# Patient Record
Sex: Male | Born: 1985 | Race: White | Hispanic: No | Marital: Married | Smoking: Never smoker
Health system: Southern US, Community
[De-identification: ages and names within clinical notes are randomized; demographics above are authoritative.]

## PROBLEM LIST (undated history)

## (undated) DIAGNOSIS — E039 Hypothyroidism, unspecified: Secondary | ICD-10-CM

## (undated) DIAGNOSIS — B1920 Unspecified viral hepatitis C without hepatic coma: Secondary | ICD-10-CM

## (undated) DIAGNOSIS — F1910 Other psychoactive substance abuse, uncomplicated: Secondary | ICD-10-CM

## (undated) DIAGNOSIS — I10 Essential (primary) hypertension: Secondary | ICD-10-CM

## (undated) DIAGNOSIS — F419 Anxiety disorder, unspecified: Secondary | ICD-10-CM

## (undated) HISTORY — DX: Essential (primary) hypertension: I10

## (undated) HISTORY — PX: OTHER SURGICAL HISTORY: SHX169

## (undated) HISTORY — DX: Hypothyroidism, unspecified: E03.9

## (undated) HISTORY — PX: BREAST SURGERY: SHX581

## (undated) HISTORY — DX: Unspecified viral hepatitis C without hepatic coma: B19.20

---

## 2009-06-17 ENCOUNTER — Ambulatory Visit (HOSPITAL_COMMUNITY): Admission: RE | Admit: 2009-06-17 | Discharge: 2009-06-17 | Payer: Self-pay | Admitting: Psychiatry

## 2010-04-18 ENCOUNTER — Emergency Department (HOSPITAL_COMMUNITY)
Admission: EM | Admit: 2010-04-18 | Discharge: 2010-04-19 | Disposition: A | Discharge status: Home / Self Care | Payer: BLUE CROSS/BLUE SHIELD | Attending: Emergency Medicine | Admitting: Emergency Medicine

## 2010-04-18 DIAGNOSIS — F1110 Opioid abuse, uncomplicated: Secondary | ICD-10-CM | POA: Insufficient documentation

## 2010-04-18 DIAGNOSIS — M549 Dorsalgia, unspecified: Secondary | ICD-10-CM | POA: Insufficient documentation

## 2010-04-18 DIAGNOSIS — G8929 Other chronic pain: Secondary | ICD-10-CM | POA: Insufficient documentation

## 2010-04-19 LAB — CBC
Hemoglobin: 13.1 g/dL (ref 13.0–17.0)
MCH: 26.8 pg (ref 26.0–34.0)
Platelets: 178 10*3/uL (ref 150–400)
RBC: 4.89 MIL/uL (ref 4.22–5.81)
WBC: 9.8 10*3/uL (ref 4.0–10.5)

## 2010-04-19 LAB — RAPID URINE DRUG SCREEN, HOSP PERFORMED
Amphetamines: NOT DETECTED
Barbiturates: NOT DETECTED
Benzodiazepines: POSITIVE — AB
Cocaine: NOT DETECTED
Opiates: POSITIVE — AB

## 2010-04-19 LAB — BASIC METABOLIC PANEL
BUN: 8 mg/dL (ref 6–23)
Chloride: 94 mEq/L — ABNORMAL LOW (ref 96–112)
GFR calc Af Amer: >60 mL/min (ref >60)
GFR calc non Af Amer: >60 mL/min (ref >60)
Potassium: 3.8 mEq/L (ref 3.5–5.1)
Sodium: 138 mEq/L (ref 135–145)

## 2010-04-19 LAB — ETHANOL: Alcohol, Ethyl (B): <5 mg/dL (ref 0–10)

## 2010-04-19 LAB — DIFFERENTIAL
Basophils Relative: 0 % (ref 0–1)
Monocytes Relative: 9 % (ref 3–12)
Neutro Abs: 6.2 10*3/uL (ref 1.7–7.7)
Neutrophils Relative %: 63 % (ref 43–77)

## 2010-04-19 LAB — URINALYSIS, ROUTINE W REFLEX MICROSCOPIC
Bilirubin Urine: NEGATIVE
Glucose, UA: NEGATIVE mg/dL
Hgb urine dipstick: NEGATIVE
Ketones, ur: NEGATIVE mg/dL
Protein, ur: NEGATIVE mg/dL
Urobilinogen, UA: 0.2 mg/dL (ref 0.0–1.0)

## 2010-04-21 ENCOUNTER — Emergency Department (HOSPITAL_COMMUNITY)
Admission: EM | Admit: 2010-04-21 | Discharge: 2010-04-21 | Disposition: A | Discharge status: Home / Self Care | Payer: BLUE CROSS/BLUE SHIELD | Attending: Emergency Medicine | Admitting: Emergency Medicine

## 2010-04-21 DIAGNOSIS — F1110 Opioid abuse, uncomplicated: Secondary | ICD-10-CM | POA: Insufficient documentation

## 2010-04-21 LAB — DIFFERENTIAL
Basophils Relative: 1 % (ref 0–1)
Eosinophils Absolute: 0.4 10*3/uL (ref 0.0–0.7)
Lymphs Abs: 2.2 10*3/uL (ref 0.7–4.0)
Monocytes Absolute: 0.6 10*3/uL (ref 0.1–1.0)
Monocytes Relative: 7 % (ref 3–12)
Neutrophils Relative %: 59 % (ref 43–77)

## 2010-04-21 LAB — CBC
MCH: 26.5 pg (ref 26.0–34.0)
MCHC: 32.4 g/dL (ref 30.0–36.0)
MCV: 81.7 fL (ref 78.0–100.0)
Platelets: 199 10*3/uL (ref 150–400)
RBC: 4.91 MIL/uL (ref 4.22–5.81)

## 2010-04-21 LAB — BASIC METABOLIC PANEL
BUN: 12 mg/dL (ref 6–23)
Chloride: 104 mEq/L (ref 96–112)
Creatinine, Ser: 1.05 mg/dL (ref 0.4–1.5)
Glucose, Bld: 82 mg/dL (ref 70–99)
Potassium: 4.5 mEq/L (ref 3.5–5.1)

## 2010-04-21 LAB — ETHANOL: Alcohol, Ethyl (B): <5 mg/dL (ref 0–10)

## 2010-04-21 LAB — RAPID URINE DRUG SCREEN, HOSP PERFORMED
Amphetamines: NOT DETECTED
Benzodiazepines: POSITIVE — AB

## 2010-06-11 DIAGNOSIS — R0602 Shortness of breath: Secondary | ICD-10-CM

## 2012-04-06 ENCOUNTER — Encounter (INDEPENDENT_AMBULATORY_CARE_PROVIDER_SITE_OTHER): Payer: Self-pay | Admitting: *Deleted

## 2012-04-11 ENCOUNTER — Ambulatory Visit (INDEPENDENT_AMBULATORY_CARE_PROVIDER_SITE_OTHER): Payer: BC Managed Care – PPO | Admitting: Internal Medicine

## 2012-06-25 ENCOUNTER — Emergency Department (HOSPITAL_COMMUNITY)
Admission: EM | Admit: 2012-06-25 | Discharge: 2012-06-25 | Disposition: A | Discharge status: Home / Self Care | Payer: BC Managed Care – PPO | Attending: Emergency Medicine | Admitting: Emergency Medicine

## 2012-06-25 ENCOUNTER — Encounter (HOSPITAL_COMMUNITY): Payer: Self-pay | Admitting: *Deleted

## 2012-06-25 DIAGNOSIS — Z79899 Other long term (current) drug therapy: Secondary | ICD-10-CM | POA: Insufficient documentation

## 2012-06-25 DIAGNOSIS — F411 Generalized anxiety disorder: Secondary | ICD-10-CM | POA: Insufficient documentation

## 2012-06-25 DIAGNOSIS — L0231 Cutaneous abscess of buttock: Secondary | ICD-10-CM | POA: Insufficient documentation

## 2012-06-25 HISTORY — DX: Anxiety disorder, unspecified: F41.9

## 2012-06-25 MED ORDER — OXYCODONE-ACETAMINOPHEN 5-325 MG PO TABS
ORAL_TABLET | ORAL | Status: AC
  Filled 2012-06-25: qty 1

## 2012-06-25 MED ORDER — DOXYCYCLINE HYCLATE 100 MG PO TABS
100.0000 mg | ORAL_TABLET | Freq: Once | ORAL | Status: AC
  Administered 2012-06-25: 100 mg via ORAL
  Filled 2012-06-25: qty 1

## 2012-06-25 MED ORDER — OXYCODONE HCL 5 MG PO TABS
5.0000 mg | ORAL_TABLET | Freq: Once | ORAL | Status: AC
  Administered 2012-06-25: 5 mg via ORAL
  Filled 2012-06-25: qty 1

## 2012-06-25 MED ORDER — OXYCODONE HCL 5 MG PO TABS: 5.0000 mg | ORAL_TABLET | Freq: Four times a day (QID) | ORAL | Status: DC | PRN

## 2012-06-25 MED ORDER — DOXYCYCLINE HYCLATE 100 MG PO CAPS: 100.0000 mg | ORAL_CAPSULE | Freq: Two times a day (BID) | ORAL | Status: DC

## 2012-06-25 MED ORDER — LIDOCAINE HCL (PF) 1 % IJ SOLN
INTRAMUSCULAR | Status: AC
  Administered 2012-06-25: 22:00:00
  Filled 2012-06-25: qty 5

## 2012-06-25 MED ORDER — HYDROCODONE-ACETAMINOPHEN 5-325 MG PO TABS: ORAL_TABLET | ORAL | Status: DC

## 2012-06-25 MED ORDER — HYDROCODONE-ACETAMINOPHEN 5-325 MG PO TABS
1.0000 | ORAL_TABLET | Freq: Once | ORAL | Status: DC
  Filled 2012-06-25: qty 1

## 2012-06-25 NOTE — ED Notes (Signed)
Pt reporting abscess on buttocks that has been getting better, but when he stopped taking antibiotics it got worse.  Area red and swollen.

## 2012-06-25 NOTE — ED Provider Notes (Signed)
History     CSN: [id number]  Arrival date & time 06/25/12  2018   First MD Initiated Contact with Patient 06/25/12 2120      Chief Complaint  Patient presents with  . Abscess    (Consider location/radiation/quality/duration/timing/severity/associated sxs/prior treatment) HPI Comments: Patient with abscess to his left buttock states that a friend tried to drain the area with a needle and he was taking Keflex for several days then stopped and now comes to the ED c/o worsening pain and redness to the  Same area.  He denies fever, chills , red streaks, abd pain or vomiting.    Patient is a 27 y.o. male presenting with abscess. The history is provided by the patient.  Abscess Location:  Ano-genital Ano-genital abscess location:  L buttock Abscess quality: induration, painful and redness   Abscess quality: not draining and no fluctuance   Red streaking: no   Duration:  3 days Progression:  Worsening Pain details:    Quality:  Throbbing   Severity:  Moderate   Timing:  Constant   Progression:  Worsening Chronicity:  Recurrent Context: not diabetes and not injected drug use   Relieved by:  Nothing Exacerbated by: sitting. Ineffective treatments:  Oral antibiotics Associated symptoms: no fatigue, no fever, no nausea and no vomiting   Risk factors: no hx of MRSA     Past Medical History  Diagnosis Date  . Anxiety     History reviewed. No pertinent past surgical history.  History reviewed. No pertinent family history.  History  Substance Use Topics  . Smoking status: Not on file  . Smokeless tobacco: Not on file  . Alcohol Use: No      Review of Systems  Constitutional: Negative for fever, chills and fatigue.  Gastrointestinal: Negative for nausea and vomiting.  Musculoskeletal: Negative for joint swelling and arthralgias.  Skin: Positive for color change.       Abscess   Hematological: Negative for adenopathy.  All other systems reviewed and are  negative.    Allergies  Review of patient's allergies indicates no known allergies.  Home Medications   Current Outpatient Rx  Name  Route  Sig  Dispense  Refill  . ALPRAZolam (XANAX) 1 MG tablet   Oral   Take 1 mg by mouth 3 (three) times daily as needed for anxiety.           There were no vitals taken for this visit.  Physical Exam  Nursing note and vitals reviewed. Constitutional: He is oriented to person, place, and time. He appears well-developed and well-nourished. No distress.  HENT:  Head: Normocephalic and atraumatic.  Cardiovascular: Normal rate, regular rhythm and normal heart sounds.   Pulmonary/Chest: Effort normal and breath sounds normal. No respiratory distress.  Abdominal: Soft. He exhibits no distension. There is no tenderness. There is no rebound and no guarding.  Musculoskeletal: Normal range of motion.  Neurological: He is alert and oriented to person, place, and time. He exhibits normal muscle tone. Coordination normal.  Skin: Skin is warm. There is erythema.  Abscess to the mid left buttock near the gluteal fold.  No surrounding erythema or drainage.  Mild to moderate induration without fluctuance.    ED Course  Procedures (including critical care time)  Labs Reviewed - No data to display No results found.     MDM    INCISION AND DRAINAGE Performed by: Maxwell Caul. Consent: Verbal consent obtained. Risks and benefits: risks, benefits and alternatives were discussed Type:  abscess  Body area: left buttock Anesthesia: local infiltration  Incision was made with a #11 scalpel.  Local anesthetic: lidocaine 1 % w/o epinephrine  Anesthetic total: 4 ml  Complexity: complex Blunt dissection to break up loculations  Drainage: purulent  Drainage amount: moderate  Packing material: 1/4 in iodoform gauze  Patient tolerance: Patient tolerated the procedure well with no immediate complications.      patient agrees to warm water  soaks, packing removal in 2 days.  Return here if needed.  Pt agrees to doxy and d/c the keflex.  VSS.  Appears stable for d/c      Jakaylah Schlafer L. Trisha Mangle, PA-C 06/27/12 2059

## 2012-06-26 ENCOUNTER — Telehealth (HOSPITAL_COMMUNITY): Payer: Self-pay | Admitting: Emergency Medicine

## 2012-06-26 NOTE — ED Notes (Signed)
Pharmacy calling to verify just 1 Rx written.  Informed pharmacy 2 Rx given and pt must get abx filled in order to get pain med filled.

## 2012-06-29 NOTE — ED Provider Notes (Signed)
Medical screening examination/treatment/procedure(s) were performed by non-physician practitioner and as supervising physician I was immediately available for consultation/collaboration.  Donnetta Hutching, MD 06/29/12 0830

## 2012-07-10 ENCOUNTER — Emergency Department (HOSPITAL_COMMUNITY)
Admission: EM | Admit: 2012-07-10 | Discharge: 2012-07-11 | Disposition: A | Discharge status: Home / Self Care | Payer: BC Managed Care – PPO | Attending: Emergency Medicine | Admitting: Emergency Medicine

## 2012-07-10 ENCOUNTER — Encounter (HOSPITAL_COMMUNITY): Payer: Self-pay | Admitting: Emergency Medicine

## 2012-07-10 DIAGNOSIS — F1110 Opioid abuse, uncomplicated: Secondary | ICD-10-CM | POA: Insufficient documentation

## 2012-07-10 DIAGNOSIS — F411 Generalized anxiety disorder: Secondary | ICD-10-CM | POA: Insufficient documentation

## 2012-07-10 DIAGNOSIS — F1910 Other psychoactive substance abuse, uncomplicated: Secondary | ICD-10-CM

## 2012-07-10 DIAGNOSIS — Z79899 Other long term (current) drug therapy: Secondary | ICD-10-CM | POA: Insufficient documentation

## 2012-07-10 LAB — COMPREHENSIVE METABOLIC PANEL
Alkaline Phosphatase: 69 U/L (ref 39–117)
BUN: 14 mg/dL (ref 6–23)
Calcium: 9.1 mg/dL (ref 8.4–10.5)
Creatinine, Ser: 0.92 mg/dL (ref 0.50–1.35)
GFR calc Af Amer: >90 mL/min (ref >90)
Glucose, Bld: 88 mg/dL (ref 70–99)
Potassium: 3.7 mEq/L (ref 3.5–5.1)
Total Protein: 7.4 g/dL (ref 6.0–8.3)

## 2012-07-10 LAB — CBC WITH DIFFERENTIAL/PLATELET
Eosinophils Absolute: 0.2 10*3/uL (ref 0.0–0.7)
Eosinophils Relative: 2 % (ref 0–5)
HCT: 41.6 % (ref 39.0–52.0)
Hemoglobin: 13.8 g/dL (ref 13.0–17.0)
Lymphs Abs: 2 10*3/uL (ref 0.7–4.0)
MCH: 27.7 pg (ref 26.0–34.0)
MCHC: 33.2 g/dL (ref 30.0–36.0)
MCV: 83.5 fL (ref 78.0–100.0)
Monocytes Absolute: 0.5 10*3/uL (ref 0.1–1.0)
Monocytes Relative: 7 % (ref 3–12)
RBC: 4.98 MIL/uL (ref 4.22–5.81)

## 2012-07-10 LAB — RAPID URINE DRUG SCREEN, HOSP PERFORMED
Cocaine: NOT DETECTED
Opiates: NOT DETECTED

## 2012-07-10 LAB — ETHANOL: Alcohol, Ethyl (B): <11 mg/dL (ref 0–11)

## 2012-07-10 NOTE — ED Notes (Signed)
Blood draw attempted, no success, LAB called for 2nd attempt

## 2012-07-10 NOTE — ED Provider Notes (Signed)
History    CSN: [id number] Arrival date & time 07/10/12  2018  None    Chief Complaint  Patient presents with  . Drug Problem   (Consider location/radiation/quality/duration/timing/severity/associated sxs/prior Treatment) HPI Comments: 27 year old male who has a history of self-reported anxiety who has also had 2 significant overdoses of Opana in the last year. He presents today because of request for detox from his opiate abuse. He states that he started using these opiate medications 9 years ago, Vicodin was his initial drug, he has since graduated to stronger drugs and has been using them as IV routing. He denies fevers or chills numbness or vomiting nausea abdominal pain back pain chest pain coughing shortness of breath or diaphoresis. He states that he last used opiates this morning. He denies alcohol abuse, he uses excessive amounts of Xanax and has had 210 tablets of Xanax over last 10 days. He denies suicidal thoughts, states that he was recently engaged and wants to turn his life around.  The history is provided by the patient and a relative.   Past Medical History  Diagnosis Date  . Anxiety    History reviewed. No pertinent past surgical history. No family history on file. History  Substance Use Topics  . Smoking status: Not on file  . Smokeless tobacco: Not on file  . Alcohol Use: No    Review of Systems  All other systems reviewed and are negative.    Allergies  Review of patient's allergies indicates no known allergies.  Home Medications   Current Outpatient Rx  Name  Route  Sig  Dispense  Refill  . ALPRAZolam (XANAX) 1 MG tablet   Oral   Take 1 mg by mouth 3 (three) times daily as needed for anxiety.          BP 137/83  Pulse 68  Temp(Src) 98.1 F (36.7 C) (Oral)  Resp 20  SpO2 100% Physical Exam  Nursing note and vitals reviewed. Constitutional: He appears well-developed and well-nourished. No distress.  HENT:  Head: Normocephalic and  atraumatic.  Mouth/Throat: Oropharynx is clear and moist. No oropharyngeal exudate.  Eyes: Conjunctivae and EOM are normal. Pupils are equal, round, and reactive to light. Right eye exhibits no discharge. Left eye exhibits no discharge. No scleral icterus.  Neck: Normal range of motion. Neck supple. No JVD present. No thyromegaly present.  Cardiovascular: Normal rate, regular rhythm, normal heart sounds and intact distal pulses.  Exam reveals no gallop and no friction rub.   No murmur heard. Pulmonary/Chest: Effort normal and breath sounds normal. No respiratory distress. He has no wheezes. He has no rales.  Abdominal: Soft. Bowel sounds are normal. He exhibits no distension and no mass. There is no tenderness.  Musculoskeletal: Normal range of motion. He exhibits no edema and no tenderness.  Lymphadenopathy:    He has no cervical adenopathy.  Neurological: He is alert. Coordination normal.  Skin: Skin is warm and dry. No rash noted. No erythema.  Psychiatric: He has a normal mood and affect. His behavior is normal.  The patient has a normal affect, he is not hallucinating, nonresponding to internal stimuli, not agitated, not suicidal    ED Course  Procedures (including critical care time) Labs Reviewed  URINE RAPID DRUG SCREEN (HOSP PERFORMED) - Abnormal; Notable for the following:    Benzodiazepines POSITIVE (*)    All other components within normal limits  COMPREHENSIVE METABOLIC PANEL - Abnormal; Notable for the following:    AST 40 (*)  ALT 94 (*)    All other components within normal limits  ETHANOL  CBC WITH DIFFERENTIAL   No results found. 1. Substance abuse     MDM  REquesting help with opiate abuse -n o signs of acute withdrawal from either opiates or benzo's.  Will contact [facility] asap for placement.    The patient has court cases pending, he has not been accepted to the detox facility, he will pursue his options tomorrow morning with his lawyer, he appears medically  stable for discharge.  I have given him a copy of his lab work and it has been faxed to the detox facility at the patient's request and the detox facility's request.  Vida Roller, MD 07/11/12 [phone]

## 2012-07-10 NOTE — ED Notes (Signed)
PT. REQUESTING DETOX FOR OPIATE ( OPANA) ADDICTION , LAST OPIATE INTAKE THIS MORNING , DENIES SUICIDAL IDEATION . CALM AND COOPERATIVE.

## 2012-07-11 NOTE — ED Notes (Signed)
Pt denies any further questions or pain upon discharge. 

## 2012-07-11 NOTE — ED Notes (Signed)
Pt ask for a coke to drink and a warm blanket. Pt was given both.

## 2012-07-11 NOTE — ED Notes (Signed)
Plan of care is updated by MD with RN and family at bedside. Pt verbalizes understanding.

## 2012-08-31 ENCOUNTER — Ambulatory Visit (HOSPITAL_COMMUNITY): Payer: BC Managed Care – PPO | Attending: Internal Medicine

## 2013-07-05 ENCOUNTER — Encounter: Payer: Self-pay | Admitting: *Deleted

## 2013-07-07 ENCOUNTER — Emergency Department: Payer: Self-pay | Admitting: Emergency Medicine

## 2013-08-16 ENCOUNTER — Ambulatory Visit (INDEPENDENT_AMBULATORY_CARE_PROVIDER_SITE_OTHER): Payer: BC Managed Care – PPO | Admitting: Gastroenterology

## 2013-08-16 ENCOUNTER — Encounter: Payer: Self-pay | Admitting: Gastroenterology

## 2013-08-16 VITALS — BP 137/88 | HR 79 | Temp 98.1°F | Resp 20 | Ht 71.0 in | Wt 190.0 lb

## 2013-08-16 DIAGNOSIS — R894 Abnormal immunological findings in specimens from other organs, systems and tissues: Secondary | ICD-10-CM

## 2013-08-16 DIAGNOSIS — R7989 Other specified abnormal findings of blood chemistry: Secondary | ICD-10-CM

## 2013-08-16 DIAGNOSIS — R945 Abnormal results of liver function studies: Secondary | ICD-10-CM

## 2013-08-16 DIAGNOSIS — R768 Other specified abnormal immunological findings in serum: Secondary | ICD-10-CM

## 2013-08-16 NOTE — Assessment & Plan Note (Addendum)
28 y/o male with positive HCV Ab test at least one year ago. It is not clear if he has had RNA testing or genotyping. H/O prior prescription drug abuse as outlined above. Patient denies abuse in the past 12 months after rehabilitation. No h/o illicit drug use or etoh use. Discussed HCV and treatment options at length with patient today. First we need to confirm viremia and genotype. He will need to have liver imaging once that's complete. If he has chronic HCV, we will work on Therapist, occupationalinsurance approval. Modes of transmission discussed with patient. He needs to notify previous partners. [facility]. Further recommendations to follow.

## 2013-08-16 NOTE — Progress Notes (Addendum)
Primary Care Physician:  Catalina Pizza, MD  Primary Gastroenterologist:  Roetta Sessions, MD   Chief Complaint  Patient presents with  . Referral    HPI:  Daniel Burch is a 28 y.o. male here for management of Hepatitis C at the request of Dr. Catalina Pizza. States he was initially diagnosed around 1-2 years ago. Risk factors include multiple sexual partners, tatoos. Denies h/o illicit drugs but he has history of prescription drug abuse with Xanax, Opana. Overdoses of Opana/Xanax in 2013, hospitalized for 3 weeks at [facility] for kidney/liver failure. States he was hospitalized for syncope early 2014. Saw Dr. Andee Lineman. Ruled out for bacterial endocarditis, but had pulmonary HTN. I don't have the records but review by Dr. Margo Aye mentioned f/u ECHO with decrease in PA pressures and mildly elevated PA systolic pressures. Has not seen Dr. Andee Lineman in over one year.  Seen in ER 07/2012 requesting detox from Xanax. States he went to rehab and has done better over the past one year. Takes prescribed Xanax 1mg  BID for anxiety. Has knee issues, MRI pending, given oxycodone for pain, not taking regularly (given limited number for short term use only). Denies etoh use. Reports negative HIV testing while in rehab last year.   Feels okay. Feels sore in upper abdomen. Not necessarily related to meals. BMs fairly regular. No melena, brbpr, vomiting, heartburn.    Has a 57 month old son. No longer with mother of his child. Has girlfriend. None of his sexual partners aware of HCV ab + status. I don't have any records indicating HCV RNA or genotype.    Current Outpatient Prescriptions  Medication Sig Dispense Refill  . oxyCODONE-acetaminophen (PERCOCET/ROXICET) 5-325 MG per tablet Take by mouth every 4 (four) hours as needed for severe pain (as needed for knee pain, rarely takes).      . ALPRAZolam (XANAX) 1 MG tablet Take 1 mg by mouth 3 (three) times daily as needed for anxiety.       No current facility-administered medications  for this visit.    Allergies as of 08/16/2013  . (No Known Allergies)    Past Medical History  Diagnosis Date  . Anxiety   . Hepatitis C     Past Surgical History  Procedure Laterality Date  . None      Family History  Problem Relation Age of Onset  . Colon cancer Neg Hx   . Liver disease      History   Social History  . Marital Status: Single    Spouse Name: N/A    Number of Children: 1  . Years of Education: N/A   Occupational History  . 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 Smoking status: Never Smoker   . Smokeless tobacco: Not on file  . Alcohol Use: No  . Drug Use: Yes     Comment: history of opiate/benzos drug abuse in past  . Sexual Activity: Not on file     Comment:     Other Topics Concern  . Not on file   Social History Narrative  . No narrative on file      ROS:  General: Negative for anorexia, weight loss, fever, chills, fatigue, weakness. Eyes: Negative for vision changes.  ENT: Negative for hoarseness, difficulty swallowing , nasal congestion. CV: Negative for chest pain, angina, palpitations, dyspnea on exertion, peripheral edema.  Respiratory: Negative for dyspnea at rest, dyspnea on exertion, cough, sputum, wheezing.  GI: See history of present illness. GU:  Negative for dysuria, hematuria, urinary incontinence, urinary frequency, nocturnal urination.  MS: Negative for joint pain, low back pain.  Derm: Negative for rash or itching.  Neuro: Negative for weakness, abnormal sensation, seizure, frequent headaches, memory loss, confusion.  Psych: Negative for anxiety, depression, suicidal ideation, hallucinations.  Endo: Negative for unusual weight change.  Heme: Negative for bruising or bleeding. Allergy: Negative for rash or hives.    Physical Examination:  BP 137/88  Pulse 79  Temp(Src) 98.1 F (36.7 C) (Oral)  Resp 20  Ht 5\' 11"  (1.803 m)  Wt 190 lb (86.183 kg)  BMI 26.51 kg/m2    General: Well-nourished, well-developed in no acute distress.  Head: Normocephalic, atraumatic.   Eyes: Conjunctiva pink, no icterus. Mouth: Oropharyngeal mucosa moist and pink , no lesions erythema or exudate. Neck: Supple without thyromegaly, masses, or lymphadenopathy.  Lungs: Clear to auscultation bilaterally.  Heart: Regular rate and rhythm, no murmurs rubs or gallops.  Abdomen: Bowel sounds are normal, mild diffuse upper abdominal tenderness, nondistended, no hepatosplenomegaly or masses, no abdominal bruits or    hernia , no rebound or guarding.   Rectal: not performed Extremities: No lower extremity edema. No clubbing or deformities.  Neuro: Alert and oriented x 4 , grossly normal neurologically.  Skin: Warm and dry, no rash or jaundice.   Psych: Alert and cooperative, normal mood and affect.  Labs: Labs 06/14/13 WBC 7100, H/H 15.9/45.4, Plate 161,096264,000, BUN 15, Cre 0.93, Tbili 1, AP 50, AST 30, ALT 79H, alb 4.4, ca 9 Hep B surface Ag: negative (08/2012) Hep B Core Ab, IgM: negative (08/2012) Hep A Ab, IgM: negative (08/2012) Hep C Ab: reactive (08/2012)      Imaging Studies: No results found.

## 2013-08-16 NOTE — Patient Instructions (Signed)
1. Please have your blood work done ASAP. It will take some of it 7-10 days to return. Once we confirm if you have active Hep C, we will start process for treatment approval with your insurance company.  2. Please don't share nail clippers, razors, toothbrushes with anyone. You should notify your past and current sexual partners of Hep C status so they can seek testing/treatment as needed.  Hepatitis C Hepatitis C is a viral infection of the liver. Infection may go undetected for months or years because symptoms may be absent or very mild. Chronic liver disease is the main danger of hepatitis C. This may lead to scarring of the liver (cirrhosis), liver failure, and liver cancer. CAUSES  Hepatitis C is caused by the hepatitis C virus (HCV). Formerly, hepatitis C infections were most commonly transmitted through blood transfusions. In the early 1990s, routine testing of donated blood for hepatitis C and exclusion of blood that tests positive for HCV began. Now, HCV is most commonly transmitted from person to person through injection drug use, sharing needles, or sex with an infected person. A caregiver may also get the infection from exposure to the blood of an infected patient by way of a cut or needle stick.  SYMPTOMS  Acute Phase Many cases of acute HCV infection are mild and cause few problems.Some people may not even realize they are sick.Symptoms in others may last a few weeks to several months and include:  Feeling very tired.  Loss of appetite.  Nausea.  Vomiting.  Abdominal pain.  Dark yellow urine.  Yellow skin and eyes (jaundice).  Itching of the skin. Chronic Phase  Between 50% to 85% of people who get HCV infection become "chronic carriers." They often have no symptoms, but the virus stays in their body.They may spread the virus to others and can get long-term liver disease.  Many people with chronic HCV infection remain healthy for many years. However, up to 1 in 5  chronically infected people may develop severe liver diseases including scarring of the liver (cirrhosis), liver failure, or liver cancer. DIAGNOSIS  Diagnosis of hepatitis C infection is made by testing blood for the presence of hepatitis C viral particles called RNA. Other tests may also be done to measure the status of current liver function, exclude other liver problems, or assess liver damage. TREATMENT  Treatment with many antiviral drugs is available and recommended for some patients with chronic HCV infection. Drug treatment is generally considered appropriate for patients who:  Are 90 years of age or older.  Have a positive test for HCV particles in the blood.  Have a liver tissue sample (biopsy) that shows chronic hepatitis and significant scarring (fibrosis).  Do not have signs of liver failure.  Have acceptable blood test results that confirm the wellness of other body organs.  Are willing to be treated and conform to treatment requirements.  Have no other circumstances that would prevent treatment from being recommended (contraindications). All people who are offered and choose to receive drug treatment must understand that careful medical follow up for many months and even years is crucial in order to make successful care possible. The goal of drug treatment is to eliminate any evidence of HCV in the blood on a long-term basis. This is called a "sustained virologic response" or SVR. Achieving a SVR is associated with a decrease in the chance of life-threatening liver problems, need for a liver transplant, liver cancer rates, and liver-related complications. Successful treatment currently requires taking  treatment drugs for at least 24 weeks and up to 72 weeks. An injected drug (interferon) given weekly and an oral antiviral medicine taken daily are usually prescribed. Side effects from these drugs are common and some may be very serious. Your response to treatment must be carefully  monitored by both you and your caregiver throughout the entire treatment period. PREVENTION There is no vaccine for hepatitis C. The only way to prevent the disease is to reduce the risk of exposure to the virus.   Avoid sharing drug needles or personal items like toothbrushes, razors, and nail clippers with an infected person.  Healthcare workers need to avoid injuries and wear appropriate protective equipment such as gloves, gowns, and face masks when performing invasive medical or nursing procedures. HOME CARE INSTRUCTIONS  To avoid making your liver disease worse:  Strictly avoid drinking alcohol.  Carefully review all new prescriptions of medicines with your caregiver. Ask your caregiver which drugs you should avoid. The following drugs are toxic to the liver, and your caregiver may tell you to avoid them:  Isoniazid.  Methyldopa.  Acetaminophen.  Anabolic steroids (muscle-building drugs).  Erythromycin.  Oral contraceptives (birth control pills).  Check with your caregiver to make sure medicine you are currently taking will not be harmful.  Periodic blood tests may be required. Follow your caregiver's advice about when you should have blood tests.  Avoid a sexual relationship until advised otherwise by your caregiver.  Avoid activities that could expose other people to your blood. Examples include sharing a toothbrush, nail clippers, razors, and needles.  Bed rest is not necessary, but it may make you feel better. Recovery time is not related to the amount of rest you receive.  This infection is contagious. Follow your caregiver's instructions in order to avoid spread of the infection. SEEK IMMEDIATE MEDICAL CARE IF:  You have increasing fatigue or weakness.  You have an oral temperature above 102 F (38.9 C), not controlled by medicine.  You develop loss of appetite, nausea, or vomiting.  You develop jaundice.  You develop easy bruising or bleeding.  You  develop any severe problems as a result of your treatment. MAKE SURE YOU:   Understand these instructions.  Will watch your condition.  Will get help right away if you are not doing well or get worse. Document Released: 12/20/1999 Document Revised: 03/16/2011 Document Reviewed: 04/05/2013 [employer] Patient Information 2015 [employer] [place]. This information is not intended to replace advice given to you by your health care provider. Make sure you discuss any questions you have with your health care provider.

## 2013-08-17 NOTE — Progress Notes (Signed)
[facility] Admitted at that time with syncopal episode that occurred after injection of Opana.  There was concern for bacterial endocarditis but patient's TEE showed normal valves. He did have evidence of severe pulmonary HTN. Hep C RIBA was positive. HIV negative. Also noted to have 12.469mm spiculated RUL lung nodule ?infectious vs inflammatory or malignancy. No known follow up per patient.

## 2013-08-18 LAB — FERRITIN: FERRITIN: 33 ng/mL (ref 22–322)

## 2013-08-18 LAB — IRON AND TIBC
%SAT: 19 % — AB (ref 20–55)
IRON: 68 ug/dL (ref 42–165)
TIBC: 362 ug/dL (ref 215–435)
UIBC: 294 ug/dL (ref 125–400)

## 2013-08-18 LAB — HEPATITIS A ANTIBODY, TOTAL: Hep A Total Ab: BORDERLINE — AB

## 2013-08-18 LAB — HEPATITIS B SURFACE ANTIBODY,QUALITATIVE: Hep B S Ab: NEGATIVE

## 2013-08-21 LAB — HCV RNA QUANT RFLX ULTRA OR GENOTYP
HCV QUANT: 334502 [IU]/mL — AB (ref <15)
HCV Quantitative Log: 5.52 {Log} — ABNORMAL HIGH (ref <1.18)

## 2013-08-21 NOTE — Progress Notes (Signed)
cc'd to pcp 

## 2013-08-22 LAB — HEPATITIS C GENOTYPE: HCV GENOTYPE: 3

## 2013-08-30 NOTE — Progress Notes (Signed)
Quick Note:  Recommend Hep A and Hep B vaccines.  Active HCV viremia, genotype 3. I am going to discuss with Dr. Karilyn Cota and Dr. Jena Gauss. Consider offering treatment with Dr. Karilyn Cota if he is willing.    ______

## 2013-09-05 ENCOUNTER — Telehealth: Payer: Self-pay | Admitting: Internal Medicine

## 2013-09-05 NOTE — Telephone Encounter (Signed)
Tried to call pt- cell number was busy, no answer at the home number and no voicemail.

## 2013-09-05 NOTE — Telephone Encounter (Signed)
Calling asking for lab results, please advise?

## 2013-09-06 NOTE — Telephone Encounter (Signed)
Tried to call pt- called cell #- busy, called home #- NA and no voicemail, called work # and got disconnected.

## 2013-09-12 NOTE — Telephone Encounter (Signed)
Mailed letter to pt

## 2013-09-12 NOTE — Telephone Encounter (Signed)
Tried to call pt- na on home number and no voicemail, home number was busy.

## 2013-09-18 ENCOUNTER — Telehealth: Payer: Self-pay | Admitting: Internal Medicine

## 2013-09-18 NOTE — Telephone Encounter (Signed)
PATIENT FATHER CALLED STATING THAT SON IS HAVING STOMACH PAIN.  I MADE HIM AN APPOINTMENT FOR 10/2013.  HE ALSO STATED SON WAS INPATIENT AT [facility] AND HE WOULD BE OUT Wednesday.  PLEASE CALL FATHER AND TO DISCUSS SONS ISSUE, HE IS AWARE OF UPCOMING OV

## 2013-09-21 NOTE — Telephone Encounter (Signed)
I spoke with the pt and he said he is doing fine.

## 2013-09-26 ENCOUNTER — Telehealth: Payer: Self-pay | Admitting: Internal Medicine

## 2013-09-26 NOTE — Telephone Encounter (Signed)
Routing to LSL 

## 2013-09-26 NOTE — Telephone Encounter (Signed)
Pt called today to say that he hasn't heard whether or not NUR would be treating his Hep C. I told him per JL that LSL was off today and would be back tomorrow and she would follow up if LSL and NUR had discuss what the plan would be. Please call him at [phone] or [phone]

## 2013-09-28 ENCOUNTER — Telehealth: Payer: Self-pay | Admitting: Internal Medicine

## 2013-09-28 NOTE — Telephone Encounter (Signed)
JL had forwarded a message to LSL regarding this.

## 2013-09-28 NOTE — Telephone Encounter (Signed)
Please let patient know we are waiting for response from Dr. Patty Sermons office.

## 2013-09-28 NOTE — Telephone Encounter (Signed)
PATIENT CALLED INQUIRING ABOUT OFFICE VISIT WITH [facility]   PLEASE CALL AT [phone]

## 2013-09-29 NOTE — Telephone Encounter (Signed)
Tried to call with no answer  

## 2013-09-29 NOTE — Telephone Encounter (Signed)
Please let patient know that Dr. Karilyn Cota will be glad to treat his Hepatitis C. Their office should be contacting him directly for an appointment. Please give him their number in case he doesn't hear from them in a couple of days.

## 2013-10-02 NOTE — Telephone Encounter (Signed)
Pt is aware. I am forwarding this note to Tammy, LPN at [facility] office.

## 2013-10-02 NOTE — Telephone Encounter (Signed)
Noted,Ihave forwarded this to Lupita Leash to make the appointment.

## 2013-10-03 NOTE — Telephone Encounter (Signed)
Please cancel and let patient know he does not need to follow up with Tobi Bastosnna this month for HCV since he will be seeing Dr. Karilyn Cotaehman for this.

## 2013-10-03 NOTE — Telephone Encounter (Signed)
Cancelled and patient is aware

## 2013-10-04 ENCOUNTER — Telehealth: Payer: Self-pay | Admitting: Internal Medicine

## 2013-10-04 ENCOUNTER — Encounter (INDEPENDENT_AMBULATORY_CARE_PROVIDER_SITE_OTHER): Payer: Self-pay | Admitting: *Deleted

## 2013-10-04 NOTE — Telephone Encounter (Signed)
pts father is aware.

## 2013-10-04 NOTE — Telephone Encounter (Signed)
PLEASE CALL FATHER ASAP REGARDING A LETTER THE PATIENT NEEDS FOR A HEARING TOMORROW    [phone]

## 2013-10-04 NOTE — Telephone Encounter (Signed)
I spoke with Daniel Registerim Shafran- his son- [name], went to detox in [place] and the detox facility did not send his parole office that information. They have accused the pt of breaking his parole. The police have put him in jail today. They have received the information from the detox facility but pt still have to have a hearing in the AM. They need a letter from us to take to the hearing stating pt has Hep C genotype 3 and that we cannot treat it here and we have referred pt to Dr.Rehman. I spoke with LSL about this and she said she wasn't sure if we could do that due to HIPPA and wanted me to ask CM about it first.

## 2013-10-04 NOTE — Telephone Encounter (Signed)
We will need permission from the patient prior to writing a letter about his health condition.

## 2013-10-06 NOTE — Telephone Encounter (Signed)
Apt has been scheduled for 10/10/13 with Terri Setzer, NP.  

## 2013-10-10 ENCOUNTER — Ambulatory Visit (INDEPENDENT_AMBULATORY_CARE_PROVIDER_SITE_OTHER): Payer: BC Managed Care – PPO | Admitting: Internal Medicine

## 2013-10-12 ENCOUNTER — Telehealth: Payer: Self-pay | Admitting: Internal Medicine

## 2013-10-12 ENCOUNTER — Encounter: Payer: Self-pay | Admitting: Gastroenterology

## 2013-10-12 NOTE — Telephone Encounter (Signed)
Dad brought POA paperwork. LSL wrote letter and it is at the front desk. I called pts dad and LMOM and told him it was ready to be picked up. POA paperwork sent to be scanned.

## 2013-10-12 NOTE — Telephone Encounter (Signed)
Patient father called regarding son , please call him at [phone]

## 2013-10-12 NOTE — Telephone Encounter (Signed)
He will have to give us a copy of "healthcare POA" before we can provide him information.

## 2013-10-12 NOTE — Telephone Encounter (Signed)
pts dad called- left voicemail- he said his son is still in jail and he now has POA. He wants a letter stating that his son has Hep C and that we have referred him to Dr. Karilyn Cotaehman for treatment.

## 2013-10-16 ENCOUNTER — Ambulatory Visit: Payer: BC Managed Care – PPO | Admitting: Gastroenterology

## 2013-10-20 ENCOUNTER — Ambulatory Visit: Payer: BC Managed Care – PPO | Admitting: Gastroenterology

## 2013-10-24 ENCOUNTER — Encounter (INDEPENDENT_AMBULATORY_CARE_PROVIDER_SITE_OTHER): Payer: Self-pay | Admitting: *Deleted

## 2013-10-24 ENCOUNTER — Telehealth (INDEPENDENT_AMBULATORY_CARE_PROVIDER_SITE_OTHER): Payer: Self-pay | Admitting: *Deleted

## 2013-10-24 NOTE — Telephone Encounter (Signed)
Daniel Burch NO SHOWED for his apt with Dorene Arerri Setzer, NP on 10/10/13. A NS letter has been mailed.

## 2013-10-27 ENCOUNTER — Telehealth: Payer: Self-pay

## 2013-10-27 NOTE — Telephone Encounter (Signed)
pts dad, Lisabeth Registerim Tapp, called today. He said pt went to court with the letter that we gave him the last time and "things didn't turn out well". He has gotten the pt a Teaching laboratory techniciandifferent lawyer and they are going back to court. According to pts father, pt needs another letter that goes more in-depth about his condition and what he has had done and what our plan was. Pt has had rectal bleeding for three weeks and dad says the jail is not doing anything for him. He also said that if they cant get a letter, they will have to subpoena one of the providers from our office to go to court.

## 2013-10-27 NOTE — Telephone Encounter (Signed)
Lisabeth Registerim Quesada- pts dad- phone number is [phone]

## 2013-10-27 NOTE — Telephone Encounter (Signed)
I spoke with Waynetta SandyBeth [phone]) in our legal department and was told that the letter we gave was fine and we did not need to give the patient another letter and any further contact concerning this issue may be filtered through their office.  I spoke with the patient's father and made him aware that we will not be able to complete another letter and moving forward on this matter, he or his attorney may contact our general counsel office at [phone] and ask for Beth Cox.

## 2013-11-23 ENCOUNTER — Telehealth: Payer: Self-pay | Admitting: Gastroenterology

## 2013-11-23 NOTE — Telephone Encounter (Signed)
I tired to call the patient, no answer,lmom 

## 2013-11-23 NOTE — Telephone Encounter (Signed)
Noted  

## 2013-11-23 NOTE — Telephone Encounter (Signed)
Per Domingo DimesBeth Cox, paralegal with [facility] we can supply the father with his son's medical records from our practice.  Darl PikesSusan will you have the patient to sign a release for his son's records.

## 2013-11-23 NOTE — Telephone Encounter (Signed)
Patient called wanted a more in depth letter from us.  I explained to him that if he wanted a more in depth letter that he would need to speak with our legal team.  He stated he would just like to retrieve his son's medical records and he would have his attorney contact our legal department if need be for a more in depth note.

## 2013-11-23 NOTE — Telephone Encounter (Signed)
I called the patient's son and made him aware that his son's record is ready and with his valid ID and a signed release he can come by to pick it up.  He said he would be by this afternoon.

## 2013-11-23 NOTE — Telephone Encounter (Signed)
PER RMR CALL FATHER(TIM Orlowski) ASAP REGARDING PATIENT HEP C   [phone]

## 2015-12-02 ENCOUNTER — Encounter (INDEPENDENT_AMBULATORY_CARE_PROVIDER_SITE_OTHER): Payer: Self-pay | Admitting: Internal Medicine

## 2015-12-02 ENCOUNTER — Encounter (INDEPENDENT_AMBULATORY_CARE_PROVIDER_SITE_OTHER): Payer: Self-pay

## 2015-12-18 ENCOUNTER — Encounter (INDEPENDENT_AMBULATORY_CARE_PROVIDER_SITE_OTHER): Payer: Self-pay | Admitting: Internal Medicine

## 2015-12-18 ENCOUNTER — Ambulatory Visit (INDEPENDENT_AMBULATORY_CARE_PROVIDER_SITE_OTHER): Payer: Self-pay | Admitting: Internal Medicine

## 2015-12-23 ENCOUNTER — Ambulatory Visit (INDEPENDENT_AMBULATORY_CARE_PROVIDER_SITE_OTHER): Payer: BLUE CROSS/BLUE SHIELD | Admitting: Internal Medicine

## 2015-12-23 ENCOUNTER — Encounter (INDEPENDENT_AMBULATORY_CARE_PROVIDER_SITE_OTHER): Payer: Self-pay | Admitting: Internal Medicine

## 2015-12-23 VITALS — BP 138/90 | HR 72 | Temp 98.0°F | Ht 71.0 in | Wt 212.7 lb

## 2015-12-23 DIAGNOSIS — E038 Other specified hypothyroidism: Secondary | ICD-10-CM

## 2015-12-23 DIAGNOSIS — I10 Essential (primary) hypertension: Secondary | ICD-10-CM

## 2015-12-23 DIAGNOSIS — B182 Chronic viral hepatitis C: Secondary | ICD-10-CM

## 2015-12-23 DIAGNOSIS — F320 Major depressive disorder, single episode, mild: Secondary | ICD-10-CM | POA: Diagnosis not present

## 2015-12-23 DIAGNOSIS — E039 Hypothyroidism, unspecified: Secondary | ICD-10-CM | POA: Insufficient documentation

## 2015-12-23 HISTORY — DX: Essential (primary) hypertension: I10

## 2015-12-23 NOTE — Patient Instructions (Signed)
Labs today. OV in 3 months.  

## 2015-12-23 NOTE — Progress Notes (Addendum)
   Subjective:    Patient ID: Daniel Burch, male    DOB: 07/27/1985, 30 y.o.   MRN: [id number]  HPIReferred by Dr .Sherril CroonVyas for Hepatitis C. Has known to have x 2 yrs. Recently discharged from prison.  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 No IV drugs in 20 months.  Had been using opiates and heroin (IV) His appetite is good. No weight loss. Has a BM once every 2 days.  Genotype 3.  Patient is on Suboxone Multiple tattoos.  11/27/2015 ALP 71, AST 63, ALT 116.  H and H 14.6 and 299 Review of Systems Past Medical History:  Diagnosis Date  . Anxiety   . Essential hypertension 12/23/2015  . Hepatitis C     Past Surgical History:  Procedure Laterality Date  . none      No Known Allergies  No current outpatient prescriptions on file prior to visit.   No current facility-administered medications on file prior to visit.    Current Outpatient Prescriptions  Medication Sig Dispense Refill  . buprenorphine-naloxone (SUBOXONE) 8-2 MG SUBL SL tablet Place 1 tablet under the tongue daily.    Marland Kitchen. levothyroxine (SYNTHROID, LEVOTHROID) 75 MCG tablet Take 75 mcg by mouth daily before breakfast.    . lisinopril (PRINIVIL,ZESTRIL) 30 MG tablet Take 15 mg by mouth daily.    . sertraline (ZOLOFT) 100 MG tablet Take 100 mg by mouth daily.     No current facility-administered medications for this visit.         Objective:   Physical Exam Blood pressure 138/90, pulse 72, temperature 98 F (36.7 C), height 5\' 11"  (1.803 m), weight 212 lb 11.2 oz (96.5 kg). Alert and oriented. Skin warm and dry. Oral mucosa is moist.   . Sclera anicteric, conjunctivae is pink. Thyroid not enlarged. No cervical lymphadenopathy. Lungs clear. Heart regular rate and rhythm.  Abdomen is soft. Bowel sounds are positive. No hepatomegaly. No abdominal masses felt. No tenderness.  No edema to lower extremities.          Assessment & Plan:  Hepatitis C. Hep C antibody, Hep C quaint, CBC, Hepatic function, US  elastro, Urine drug screen. OV in 3 months.  Brochure on Hepatitis C given to patient.

## 2015-12-24 ENCOUNTER — Encounter (INDEPENDENT_AMBULATORY_CARE_PROVIDER_SITE_OTHER): Payer: Self-pay | Admitting: Internal Medicine

## 2015-12-24 LAB — PAIN MGMT, PROFILE 1 W/O CONF, U
AMPHETAMINES: NEGATIVE ng/mL (ref <500)
BARBITURATES: NEGATIVE ng/mL (ref <300)
BENZODIAZEPINES: POSITIVE ng/mL — AB (ref <100)
Cocaine Metabolite: NEGATIVE ng/mL (ref <150)
Creatinine: 109 mg/dL (ref >=20.0)
MARIJUANA METABOLITE: NEGATIVE ng/mL (ref <20)
Methadone Metabolite: NEGATIVE ng/mL (ref <100)
OXIDANT: NEGATIVE ug/mL (ref <200)
Opiates: NEGATIVE ng/mL (ref <100)
Oxycodone: NEGATIVE ng/mL (ref <100)
Phencyclidine: NEGATIVE ng/mL (ref <25)
pH: 6.94 (ref 4.5–9.0)

## 2015-12-24 LAB — CBC WITH DIFFERENTIAL/PLATELET
Basophils Absolute: 63 cells/uL (ref 0–200)
Basophils Relative: 1 %
EOS PCT: 2 %
Eosinophils Absolute: 126 cells/uL (ref 15–500)
HCT: 42.9 % (ref 38.5–50.0)
Hemoglobin: 13.9 g/dL (ref 13.2–17.1)
Lymphocytes Relative: 26 %
Lymphs Abs: 1638 cells/uL (ref 850–3900)
MCH: 27.7 pg (ref 27.0–33.0)
MCHC: 32.4 g/dL (ref 32.0–36.0)
MCV: 85.6 fL (ref 80.0–100.0)
MONOS PCT: 6 %
MPV: 9.9 fL (ref 7.5–12.5)
Monocytes Absolute: 378 cells/uL (ref 200–950)
NEUTROS ABS: 4095 {cells}/uL (ref 1500–7800)
Neutrophils Relative %: 65 %
PLATELETS: 264 10*3/uL (ref 140–400)
RBC: 5.01 MIL/uL (ref 4.20–5.80)
RDW: 14.4 % (ref 11.0–15.0)
WBC: 6.3 10*3/uL (ref 3.8–10.8)

## 2015-12-24 LAB — HEPATITIS C ANTIBODY: HCV Ab: REACTIVE — AB

## 2015-12-24 LAB — AFP TUMOR MARKER: AFP TUMOR MARKER: 1.1 ng/mL (ref <6.1)

## 2015-12-24 LAB — HEPATIC FUNCTION PANEL
ALT: 143 U/L — ABNORMAL HIGH (ref 9–46)
AST: 82 U/L — ABNORMAL HIGH (ref 10–40)
Albumin: 4.2 g/dL (ref 3.6–5.1)
Alkaline Phosphatase: 63 U/L (ref 40–115)
BILIRUBIN INDIRECT: 0.3 mg/dL (ref 0.2–1.2)
BILIRUBIN TOTAL: 0.4 mg/dL (ref 0.2–1.2)
Bilirubin, Direct: 0.1 mg/dL (ref <=0.2)
TOTAL PROTEIN: 6.9 g/dL (ref 6.1–8.1)

## 2015-12-24 LAB — HEPATITIS B SURFACE ANTIGEN: Hepatitis B Surface Ag: NEGATIVE

## 2015-12-26 LAB — HEPATITIS C RNA QUANTITATIVE
HCV QUANT LOG: 4.48 {Log} — AB (ref <1.18)
HCV Quantitative: 29996 IU/mL — ABNORMAL HIGH (ref <15)

## 2015-12-27 ENCOUNTER — Encounter (INDEPENDENT_AMBULATORY_CARE_PROVIDER_SITE_OTHER): Payer: Self-pay

## 2016-01-01 ENCOUNTER — Ambulatory Visit (HOSPITAL_COMMUNITY): Payer: BLUE CROSS/BLUE SHIELD

## 2016-01-02 ENCOUNTER — Ambulatory Visit (HOSPITAL_COMMUNITY): Admission: RE | Admit: 2016-01-02 | Payer: BLUE CROSS/BLUE SHIELD | Source: Ambulatory Visit

## 2016-01-07 ENCOUNTER — Telehealth (INDEPENDENT_AMBULATORY_CARE_PROVIDER_SITE_OTHER): Payer: Self-pay | Admitting: Internal Medicine

## 2016-01-07 ENCOUNTER — Encounter (INDEPENDENT_AMBULATORY_CARE_PROVIDER_SITE_OTHER): Payer: Self-pay

## 2016-01-07 NOTE — Telephone Encounter (Signed)
Daniel Burch, Epclusa x 12 weeks. 

## 2016-01-08 NOTE — Telephone Encounter (Signed)
Paper work has been completed and will be sent to [facility] for them to do the PA for Epclusa x12 weeks of treatment. Patient will be made aware of the outcome , as this information is sent to us.

## 2016-01-16 ENCOUNTER — Telehealth (INDEPENDENT_AMBULATORY_CARE_PROVIDER_SITE_OTHER): Payer: Self-pay | Admitting: Internal Medicine

## 2016-01-16 NOTE — Telephone Encounter (Signed)
Patient called, he stated that he would like to speak to you about the medication that he is supposed to start taking soon.  [phone]

## 2016-01-16 NOTE — Telephone Encounter (Signed)
Written Rx for Hepatitis A and B vaccine sent to front desk for patient

## 2016-01-21 ENCOUNTER — Telehealth (INDEPENDENT_AMBULATORY_CARE_PROVIDER_SITE_OTHER): Payer: Self-pay | Admitting: Internal Medicine

## 2016-01-21 NOTE — Telephone Encounter (Signed)
I have spoken with patient.  He says we should have received medication but we have not. [facility]

## 2016-01-21 NOTE — Telephone Encounter (Signed)
The patient called and wants to know if his medication has come in.  [phone]

## 2016-01-27 ENCOUNTER — Other Ambulatory Visit (INDEPENDENT_AMBULATORY_CARE_PROVIDER_SITE_OTHER): Payer: Self-pay | Admitting: *Deleted

## 2016-01-27 ENCOUNTER — Telehealth (INDEPENDENT_AMBULATORY_CARE_PROVIDER_SITE_OTHER): Payer: Self-pay | Admitting: *Deleted

## 2016-01-27 ENCOUNTER — Telehealth (INDEPENDENT_AMBULATORY_CARE_PROVIDER_SITE_OTHER): Payer: Self-pay | Admitting: Internal Medicine

## 2016-01-27 DIAGNOSIS — B182 Chronic viral hepatitis C: Secondary | ICD-10-CM

## 2016-01-27 NOTE — Telephone Encounter (Signed)
I have spoken with patient. See telephone encounter 

## 2016-01-27 NOTE — Telephone Encounter (Signed)
Tammy, Hepatic, CBC and Hep C quaint in 4 weeks. He started 01/24/2015. Make sure the pharmacy delivers the rest of supply to our office.   Hope, OV in 8 weeks.

## 2016-01-27 NOTE — Telephone Encounter (Signed)
got medication from [facility], takes every morning, will come by tomorrow for paper for hep b test

## 2016-01-27 NOTE — Telephone Encounter (Signed)
Labs are noted for 8 weeks. Sent a email, [facility] to ask that all other Rx be sent to our office.

## 2016-01-30 ENCOUNTER — Encounter (INDEPENDENT_AMBULATORY_CARE_PROVIDER_SITE_OTHER): Payer: Self-pay | Admitting: *Deleted

## 2016-01-30 ENCOUNTER — Other Ambulatory Visit (INDEPENDENT_AMBULATORY_CARE_PROVIDER_SITE_OTHER): Payer: Self-pay | Admitting: *Deleted

## 2016-01-30 DIAGNOSIS — B182 Chronic viral hepatitis C: Secondary | ICD-10-CM

## 2016-02-05 NOTE — Telephone Encounter (Signed)
Patient already has an appointment for 03/23/16 at 1:45pm

## 2016-02-12 LAB — CBC
HEMATOCRIT: 41.5 % (ref 38.5–50.0)
Hemoglobin: 13.6 g/dL (ref 13.2–17.1)
MCH: 28.3 pg (ref 27.0–33.0)
MCHC: 32.8 g/dL (ref 32.0–36.0)
MCV: 86.3 fL (ref 80.0–100.0)
MPV: 9.9 fL (ref 7.5–12.5)
Platelets: 204 10*3/uL (ref 140–400)
RBC: 4.81 MIL/uL (ref 4.20–5.80)
RDW: 13.5 % (ref 11.0–15.0)
WBC: 5.9 10*3/uL (ref 3.8–10.8)

## 2016-02-12 LAB — HEPATIC FUNCTION PANEL
ALT: 43 U/L (ref 9–46)
AST: 28 U/L (ref 10–40)
Albumin: 4.1 g/dL (ref 3.6–5.1)
Alkaline Phosphatase: 53 U/L (ref 40–115)
BILIRUBIN DIRECT: 0.1 mg/dL (ref <=0.2)
Indirect Bilirubin: 0.3 mg/dL (ref 0.2–1.2)
TOTAL PROTEIN: 6.7 g/dL (ref 6.1–8.1)
Total Bilirubin: 0.4 mg/dL (ref 0.2–1.2)

## 2016-02-14 ENCOUNTER — Telehealth (INDEPENDENT_AMBULATORY_CARE_PROVIDER_SITE_OTHER): Payer: Self-pay | Admitting: Internal Medicine

## 2016-02-14 NOTE — Telephone Encounter (Signed)
Patient called, stated that [facility] and had some questions.  I did let him know that Camelia Engerri was not in today, but she'd see the message on Monday.  [phone]

## 2016-02-17 NOTE — Telephone Encounter (Signed)
I have spoken to patient. All labs look good. Waiting on Quaint

## 2016-02-20 LAB — HEPATITIS C RNA QUANTITATIVE
HCV QUANT LOG: NOT DETECTED {Log_IU}/mL
HCV Quantitative: <15 IU/mL

## 2016-03-11 ENCOUNTER — Other Ambulatory Visit (INDEPENDENT_AMBULATORY_CARE_PROVIDER_SITE_OTHER): Payer: Self-pay | Admitting: *Deleted

## 2016-03-11 DIAGNOSIS — B182 Chronic viral hepatitis C: Secondary | ICD-10-CM

## 2016-03-23 ENCOUNTER — Ambulatory Visit (INDEPENDENT_AMBULATORY_CARE_PROVIDER_SITE_OTHER): Payer: BLUE CROSS/BLUE SHIELD | Admitting: Internal Medicine

## 2016-03-30 ENCOUNTER — Encounter (INDEPENDENT_AMBULATORY_CARE_PROVIDER_SITE_OTHER): Payer: Self-pay | Admitting: *Deleted

## 2016-03-30 ENCOUNTER — Other Ambulatory Visit (INDEPENDENT_AMBULATORY_CARE_PROVIDER_SITE_OTHER): Payer: Self-pay | Admitting: *Deleted

## 2016-03-30 DIAGNOSIS — B182 Chronic viral hepatitis C: Secondary | ICD-10-CM

## 2016-04-02 ENCOUNTER — Encounter: Payer: Self-pay | Admitting: "Endocrinology

## 2016-04-02 ENCOUNTER — Ambulatory Visit (INDEPENDENT_AMBULATORY_CARE_PROVIDER_SITE_OTHER): Payer: BLUE CROSS/BLUE SHIELD | Admitting: "Endocrinology

## 2016-04-02 VITALS — BP 122/78 | HR 74 | Ht 71.0 in | Wt 224.0 lb

## 2016-04-02 DIAGNOSIS — N62 Hypertrophy of breast: Secondary | ICD-10-CM

## 2016-04-02 DIAGNOSIS — E221 Hyperprolactinemia: Secondary | ICD-10-CM | POA: Insufficient documentation

## 2016-04-02 NOTE — Progress Notes (Signed)
Subjective:    Patient ID: Daniel Burch, male    DOB: Jan 29, 1985, PCP Ignatius Specking, MD   Past Medical History:  Diagnosis Date  . Anxiety   . Essential hypertension 12/23/2015  . Hepatitis C   . Hypothyroid    Past Surgical History:  Procedure Laterality Date  . none     Social History   Social History  . Marital status: Single    Spouse name: N/A  . Number of children: 1  . Years of education: N/A   Occupational History  . 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 Smoking status: Never Smoker  . Smokeless tobacco: Never Used  . Alcohol use No  . 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 Sexual activity: Not Asked     Comment:     Other Topics Concern  . None   Social History Narrative  . None   Outpatient Encounter Prescriptions as of 04/02/2016  Medication Sig  . Sofosbuvir-Velpatasvir (EPCLUSA PO) Take by mouth.  . buprenorphine-naloxone (SUBOXONE) 8-2 MG SUBL SL tablet Place 1 tablet under the tongue daily.  Marland Kitchen levothyroxine (SYNTHROID, LEVOTHROID) 75 MCG tablet Take 75 mcg by mouth daily before breakfast.  . sertraline (ZOLOFT) 100 MG tablet Take 100 mg by mouth daily.  . [DISCONTINUED] lisinopril (PRINIVIL,ZESTRIL) 30 MG tablet Take 15 mg by mouth daily.   No facility-administered encounter medications on file as of 04/02/2016.    ALLERGIES: No Known Allergies  VACCINATION STATUS:  There is no immunization history on file for this patient.  HPI 31 year old gentleman with medical history as above. He is being seen in consultation for hyperprolactinemia Ignatius Specking, MD. - His medical history includes history of chronic hepatitis C currently on treatment. -Patient also has history of mood disorders currently on Zoloft, pain syndrome being managed with Suboxone. -Over the last 2 years he was observed to have progressively increasing bilateral enlargement of  breasts, not associated with galactorrhea. -He noticed that sometimes it becomes painful especially on the left. -In February 2018 he underwent blood work including prolactin which was marginally elevated at 21 (normal 4-15). -Patient denies any history of head injury, denies any visual field deficit. -He is reporting 25 pounds of progressive weight gain over the last 2 years.  Review of Systems  Constitutional: no weight gain/loss, no fatigue, no subjective hyperthermia, no subjective hypothermia Eyes: no blurry vision, no xerophthalmia ENT: no sore throat, no nodules palpated in throat, no dysphagia/odynophagia, no hoarseness Cardiovascular: no Chest Pain, no Shortness of Breath, no palpitations, no leg swelling Respiratory: no cough, no SOB Gastrointestinal: no Nausea/Vomiting/Diarhhea Musculoskeletal: no muscle/joint aches Skin: no rashes Neurological: no tremors, no numbness, no tingling, no dizziness Psychiatric: no depression, no anxiety  Objective:    BP 122/78   Pulse 74   Ht 5\' 11"  (1.803 m)   Wt 224 lb (101.6 kg)   BMI 31.24 kg/m   Wt Readings from Last 3 Encounters:  04/02/16 224 lb (101.6 kg)  12/23/15 212 lb 11.2 oz (96.5 kg)  08/16/13 190 lb (86.2 kg)    Physical Exam  Constitutional: Significantly over weight for hight, not in acute distress, normal state of mind Eyes: PERRLA, EOMI, no exophthalmos ENT: moist mucous membranes, no thyromegaly, no cervical lymphadenopathy Cardiovascular: normal precordial activity, Regular Rate and Rhythm, no Murmur/Rubs/Gallops Respiratory:  adequate breathing efforts,  no gross chest deformity, Clear to auscultation bilaterally Gastrointestinal: abdomen soft, Non -tender, No distension, Bowel Sounds present Musculoskeletal: no gross deformities, strength intact in all four extremities Skin:   He has bilateral gynecomastia, with prominent 4 cm left subareolar lump , no galactorrhea. He has extensive tattoos, no acute  rashes Neurological: no tremor with outstretched hands, Deep tendon reflexes normal in all four extremities.  CMP ( most recent) CMP     Component Value Date/Time   NA 137 07/10/2012 2150   K 3.7 07/10/2012 2150   CL 100 07/10/2012 2150   CO2 29 07/10/2012 2150   GLUCOSE 88 07/10/2012 2150   BUN 14 07/10/2012 2150   CREATININE 0.92 07/10/2012 2150   CALCIUM 9.1 07/10/2012 2150   PROT 6.7 02/12/2016 0935   ALBUMIN 4.1 02/12/2016 0935   AST 28 02/12/2016 0935   ALT 43 02/12/2016 0935   ALKPHOS 53 02/12/2016 0935   BILITOT 0.4 02/12/2016 0935   GFRNONAA >90 07/10/2012 2150   GFRAA >90 07/10/2012 2150    Every 20 05/24/2016 prolactin level was 21 (normal 4-52)    Assessment & Plan:   1. Hyperprolactinemia (HCC) 2. Gynecomastia 3. Hypothyroidism - He is being seen at the kind request of Dr. Sherril CroonVyas. I have reviewed his records and clinically evaluated this patient.  This patient has clinically significant gynecomastia , with left breast lump. Although he has multiple risk factors for gynecomastia including hepatitis C,  It could as well be related to the hyperprolactinemia he has. However, it is essential to rule out neoplastic process first. I will proceed to obtain ultrasound of bilateral breasts. If these study is  reassuring, he would be considered for cabergoline therapy to lower prolactin levels. The hyperprolactinemia is  likely related to the Suboxone he is taking. He will need repeat labs for prolactin levels. There is no immediate needs to do pituitary/sella imaging at this time.  For hypothyroidism, he is on Synthroid 75 g by mouth every morning. His TSH from 02/27/2016 was 3.3.  - We discussed about correct intake of levothyroxine, at fasting, with water, separated by at least 30 minutes from breakfast, and separated by more than 4 hours from calcium, iron, multivitamins, acid reflux medications (PPIs). -Patient is made aware of the fact that thyroid hormone replacement  is needed for life, dose to be adjusted by periodic monitoring of thyroid function tests.  - I advised patient to maintain close follow up with Ignatius Speckinghruv B Vyas, MD for primary care needs.  Follow up plan: Return in about 4 weeks (around 04/30/2016) for with breast ultrasound results.Marquis Lunch.  Gebre Dell Briner, MD Phone: [phone]  Fax: [phone]   04/02/2016, 10:05 AM

## 2016-04-08 ENCOUNTER — Other Ambulatory Visit: Payer: Self-pay | Admitting: "Endocrinology

## 2016-04-08 DIAGNOSIS — N644 Mastodynia: Secondary | ICD-10-CM

## 2016-04-08 DIAGNOSIS — N62 Hypertrophy of breast: Secondary | ICD-10-CM

## 2016-04-10 LAB — CBC
HEMATOCRIT: 41.4 % (ref 38.5–50.0)
HEMOGLOBIN: 13.7 g/dL (ref 13.2–17.1)
MCH: 27.9 pg (ref 27.0–33.0)
MCHC: 33.1 g/dL (ref 32.0–36.0)
MCV: 84.3 fL (ref 80.0–100.0)
MPV: 9.4 fL (ref 7.5–12.5)
Platelets: 228 10*3/uL (ref 140–400)
RBC: 4.91 MIL/uL (ref 4.20–5.80)
RDW: 13.8 % (ref 11.0–15.0)
WBC: 5.2 10*3/uL (ref 3.8–10.8)

## 2016-04-10 LAB — HEPATIC FUNCTION PANEL
ALBUMIN: 3.8 g/dL (ref 3.6–5.1)
ALK PHOS: 69 U/L (ref 40–115)
ALT: 65 U/L — AB (ref 9–46)
AST: 42 U/L — AB (ref 10–40)
BILIRUBIN TOTAL: 0.4 mg/dL (ref 0.2–1.2)
Bilirubin, Direct: 0.1 mg/dL (ref <=0.2)
Indirect Bilirubin: 0.3 mg/dL (ref 0.2–1.2)
Total Protein: 6.6 g/dL (ref 6.1–8.1)

## 2016-04-13 LAB — HEPATITIS C RNA QUANTITATIVE
HCV QUANT LOG: NOT DETECTED {Log_IU}/mL
HCV QUANT: NOT DETECTED [IU]/mL

## 2016-04-14 ENCOUNTER — Other Ambulatory Visit: Payer: Self-pay | Admitting: "Endocrinology

## 2016-04-14 DIAGNOSIS — N644 Mastodynia: Secondary | ICD-10-CM

## 2016-04-20 ENCOUNTER — Encounter (INDEPENDENT_AMBULATORY_CARE_PROVIDER_SITE_OTHER): Payer: Self-pay | Admitting: Internal Medicine

## 2016-04-20 NOTE — Progress Notes (Signed)
Changed patient's appointment to 10/20/16 at 8:30am.  A letter was mailed to the patient.

## 2016-04-28 ENCOUNTER — Ambulatory Visit (HOSPITAL_COMMUNITY)
Admission: RE | Admit: 2016-04-28 | Discharge: 2016-04-28 | Disposition: A | Discharge status: Home / Self Care | Payer: BLUE CROSS/BLUE SHIELD | Source: Ambulatory Visit | Attending: "Endocrinology | Admitting: "Endocrinology

## 2016-04-28 DIAGNOSIS — N644 Mastodynia: Secondary | ICD-10-CM | POA: Diagnosis present

## 2016-05-01 ENCOUNTER — Encounter: Payer: Self-pay | Admitting: "Endocrinology

## 2016-05-01 ENCOUNTER — Ambulatory Visit (INDEPENDENT_AMBULATORY_CARE_PROVIDER_SITE_OTHER): Payer: BLUE CROSS/BLUE SHIELD | Admitting: "Endocrinology

## 2016-05-01 VITALS — BP 123/85 | HR 70 | Ht 71.0 in | Wt 223.0 lb

## 2016-05-01 DIAGNOSIS — N62 Hypertrophy of breast: Secondary | ICD-10-CM

## 2016-05-01 DIAGNOSIS — E039 Hypothyroidism, unspecified: Secondary | ICD-10-CM | POA: Diagnosis not present

## 2016-05-01 DIAGNOSIS — E221 Hyperprolactinemia: Secondary | ICD-10-CM | POA: Diagnosis not present

## 2016-05-01 MED ORDER — CABERGOLINE 0.5 MG PO TABS: 0.2500 mg | ORAL_TABLET | ORAL | 1 refills | Status: DC

## 2016-05-01 NOTE — Progress Notes (Signed)
Subjective:    Patient ID: Daniel Burch, male    DOB: [birth year], PCP Ignatius Specking, MD   Past Medical History:  Diagnosis Date  . Anxiety   . Essential hypertension 12/23/2015  . Hepatitis C   . Hypothyroid    Past Surgical History:  Procedure Laterality Date  . none     Social History   Social History  . Marital status: Single    Spouse name: N/A  . Number of children: 1  . Years of education: N/A   Occupational History  . 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 Smoking status: Never Smoker  . Smokeless tobacco: Never Used  . Alcohol use No  . 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 Sexual activity: Not on file     Comment:     Other Topics Concern  . Not on file   Social History Narrative  . No narrative on file   Outpatient Encounter Prescriptions as of 05/01/2016  Medication Sig  . buprenorphine-naloxone (SUBOXONE) 8-2 MG SUBL SL tablet Place 1 tablet under the tongue daily.  Melene Muller ON 05/04/2016] cabergoline (DOSTINEX) 0.5 MG tablet Take 0.5 tablets (0.25 mg total) by mouth 2 (two) times a week.  . levothyroxine (SYNTHROID, LEVOTHROID) 75 MCG tablet Take 75 mcg by mouth daily before breakfast.  . sertraline (ZOLOFT) 100 MG tablet Take 100 mg by mouth daily.  . Sofosbuvir-Velpatasvir (EPCLUSA PO) Take by mouth.   No facility-administered encounter medications on file as of 05/01/2016.    ALLERGIES: No Known Allergies  VACCINATION STATUS:  There is no immunization history on file for this patient.  HPI 31 year old gentleman with medical history as above. He is being seen in f/u for hyperprolactinemia .  - His medical history includes history of chronic hepatitis C currently on treatment. -Patient also has history of mood disorders currently on Zoloft, pain syndrome being managed with Suboxone. -Over the last 2 years he was observed to have  progressively increasing bilateral enlargement of breasts, not associated with galactorrhea.  -He noticed that sometimes it becomes painful especially on the left. - His recent mammogram was negative for malignancy. -In February 2018 he underwent blood work including prolactin which was marginally elevated at 21 (normal 4-15). -Patient denies any history of head injury, denies any visual field deficit. -He is reporting 25 pounds of progressive weight gain over the last 2 years, has steady weight since last visit.  Review of Systems  Constitutional: no weight gain/loss, no fatigue, no subjective hyperthermia, no subjective hypothermia Eyes: no blurry vision, no xerophthalmia ENT: no sore throat, no nodules palpated in throat, no dysphagia/odynophagia, no hoarseness Cardiovascular: no Chest Pain, no Shortness of Breath, no palpitations, no leg swelling Respiratory: no cough, no SOB Gastrointestinal: no Nausea/Vomiting/Diarhhea Musculoskeletal: no muscle/joint aches Skin: no rashes Neurological: no tremors, no numbness, no tingling, no dizziness Psychiatric: no depression, no anxiety  Objective:    BP 123/85   Pulse 70   Ht  (1.803 m)   Wt 223 lb (101.2 kg)   BMI 31.10 kg/m   Wt Readings from Last 3 Encounters:  05/01/16 223 lb (101.2 kg)  04/02/16 224 lb (101.6 kg)  12/23/15 212 lb 11.2 oz (96.5 kg)    Physical Exam  Constitutional: Significantly over weight for hight, not in acute distress, normal state of mind Eyes:  PERRLA, EOMI, no exophthalmos ENT: moist mucous membranes, no thyromegaly, no cervical lymphadenopathy Cardiovascular: normal precordial activity, Regular Rate and Rhythm, no Murmur/Rubs/Gallops Respiratory:  adequate breathing efforts, no gross chest deformity, Clear to auscultation bilaterally Gastrointestinal: abdomen soft, Non -tender, No distension, Bowel Sounds present Musculoskeletal: no gross deformities, strength intact in all four  extremities Skin:   He has bilateral gynecomastia, with prominent 4 cm left subareolar lump , no galactorrhea. He has extensive tattoos, no acute rashes Neurological: no tremor with outstretched hands, Deep tendon reflexes normal in all four extremities.  CMP ( most recent) CMP     Component Value Date/Time   NA 137 07/10/2012 2150   K 3.7 07/10/2012 2150   CL 100 07/10/2012 2150   CO2 29 07/10/2012 2150   GLUCOSE 88 07/10/2012 2150   BUN 14 07/10/2012 2150   CREATININE 0.92 07/10/2012 2150   CALCIUM 9.1 07/10/2012 2150   PROT 6.6 04/10/2016 0957   ALBUMIN 3.8 04/10/2016 0957   AST 42 (H) 04/10/2016 0957   ALT 65 (H) 04/10/2016 0957   ALKPHOS 69 04/10/2016 0957   BILITOT 0.4 04/10/2016 0957   GFRNONAA >90 07/10/2012 2150   GFRAA >90 07/10/2012 2150    Every 20 05/24/2016 prolactin level was 21 (normal 4-52)  Mammogram 1 04/28/2016 was reviewed to be not suggestive of malignancy.  Assessment & Plan:   1. Hyperprolactinemia (HCC) 2. Gynecomastia 3. Hypothyroidism  I have reviewed his records and clinically evaluated this patient.  This patient has clinically significant gynecomastia , with left breast lump. Although he has multiple risk factors for gynecomastia including hepatitis C,  It could as well be related to the hyperprolactinemia he has. Since his mammogram is negative for malignancy, he will be considered for cabergoline therapy to lower prolactin levels- initiate Cabergoline 0.25 mg by mouth twice a week to advance based on response and tolerance. The hyperprolactinemia is  likely related to the Suboxone he is taking. He will need repeat labs for prolactin levels in 3 months. There is no immediate needs to do pituitary/sella imaging at this time.  - Cabergoline therapy is not expected to to reverse gynecomastia, if he still has significant tissue after 12-18 months of therapy, he will be considered for surgery referral.  For hypothyroidism, he is on Synthroid 75 g  by mouth every morning. His TSH from 02/27/2016 was 3.3.  - We discussed about correct intake of levothyroxine, at fasting, with water, separated by at least 30 minutes from breakfast, and separated by more than 4 hours from calcium, iron, multivitamins, acid reflux medications (PPIs). -Patient is made aware of the fact that thyroid hormone replacement is needed for life, dose to be adjusted by periodic monitoring of thyroid function tests.  - I advised patient to maintain close follow up with Ignatius Specking, MD for primary care needs.  Follow up plan: Return in about 3 months (around 07/31/2016) for follow up with pre-visit labs.  Marquis Lunch, MD Phone: [phone]  Fax: [phone]   05/01/2016, 10:26 AM

## 2016-07-06 ENCOUNTER — Other Ambulatory Visit: Payer: Self-pay

## 2016-07-06 MED ORDER — CABERGOLINE 0.5 MG PO TABS: 0.2500 mg | ORAL_TABLET | ORAL | 1 refills | Status: DC

## 2016-07-15 ENCOUNTER — Ambulatory Visit (INDEPENDENT_AMBULATORY_CARE_PROVIDER_SITE_OTHER): Payer: BLUE CROSS/BLUE SHIELD | Admitting: Internal Medicine

## 2016-08-04 ENCOUNTER — Other Ambulatory Visit: Payer: Self-pay | Admitting: "Endocrinology

## 2016-08-04 LAB — TSH: TSH: 1.82 m[IU]/L (ref 0.40–4.50)

## 2016-08-04 LAB — T4, FREE: FREE T4: 1.3 ng/dL (ref 0.8–1.8)

## 2016-08-05 ENCOUNTER — Ambulatory Visit: Payer: BLUE CROSS/BLUE SHIELD | Admitting: "Endocrinology

## 2016-08-05 LAB — PROLACTIN

## 2016-08-18 ENCOUNTER — Ambulatory Visit: Payer: BLUE CROSS/BLUE SHIELD | Admitting: "Endocrinology

## 2016-08-18 ENCOUNTER — Encounter: Payer: Self-pay | Admitting: "Endocrinology

## 2016-09-15 ENCOUNTER — Ambulatory Visit (INDEPENDENT_AMBULATORY_CARE_PROVIDER_SITE_OTHER): Payer: BLUE CROSS/BLUE SHIELD | Admitting: "Endocrinology

## 2016-09-15 VITALS — BP 129/72 | HR 80 | Ht 71.0 in | Wt 204.0 lb

## 2016-09-15 DIAGNOSIS — E221 Hyperprolactinemia: Secondary | ICD-10-CM | POA: Diagnosis not present

## 2016-09-15 DIAGNOSIS — I10 Essential (primary) hypertension: Secondary | ICD-10-CM | POA: Diagnosis not present

## 2016-09-15 DIAGNOSIS — E039 Hypothyroidism, unspecified: Secondary | ICD-10-CM | POA: Diagnosis not present

## 2016-09-15 MED ORDER — LEVOTHYROXINE SODIUM 75 MCG PO TABS: 75.0000 ug | ORAL_TABLET | Freq: Every day | ORAL | 6 refills | Status: AC

## 2016-09-15 NOTE — Progress Notes (Signed)
Subjective:    Patient ID: Daniel Burch, male    DOB: Aug 13, 1985, PCP Ignatius Specking, MD   Past Medical History:  Diagnosis Date  . Anxiety   . Essential hypertension 12/23/2015  . Hepatitis C   . Hypothyroid    Past Surgical History:  Procedure Laterality Date  . none     Social History   Social History  . Marital status: Single    Spouse name: N/A  . Number of children: 1  . Years of education: N/A   Occupational History  . 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 Smoking status: Never Smoker  . Smokeless tobacco: Never Used  . Alcohol use No  . 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 Sexual activity: Not Asked     Comment:     Other Topics Concern  . None   Social History Narrative  . None   Outpatient Encounter Prescriptions as of 09/15/2016  Medication Sig  . buprenorphine-naloxone (SUBOXONE) 8-2 MG SUBL SL tablet Place 1 tablet under the tongue daily.  Marland Kitchen levothyroxine (SYNTHROID, LEVOTHROID) 75 MCG tablet Take 1 tablet (75 mcg total) by mouth daily before breakfast.  . sertraline (ZOLOFT) 100 MG tablet Take 100 mg by mouth daily.  . Sofosbuvir-Velpatasvir (EPCLUSA PO) Take by mouth.  . [DISCONTINUED] cabergoline (DOSTINEX) 0.5 MG tablet Take 0.5 tablets (0.25 mg total) by mouth 2 (two) times a week.  . [DISCONTINUED] levothyroxine (SYNTHROID, LEVOTHROID) 75 MCG tablet Take 75 mcg by mouth daily before breakfast.   No facility-administered encounter medications on file as of 09/15/2016.    ALLERGIES: No Known Allergies  VACCINATION STATUS:  There is no immunization history on file for this patient.  HPI 31 year old gentleman with medical history as above. He is being seen in f/u for hyperprolactinemia .  - His medical history includes history of chronic hepatitis C currently on treatment. -Patient also has history of mood disorders currently on Zoloft,  pain syndrome being managed with Suboxone. -Over the last 2 years he was observed to have progressively increasing bilateral enlargement of breasts, not associated with galactorrhea.  -He noticed that sometimes it becomes painful especially on the left. - His recent mammogram was negative for malignancy. -In February 2018 he underwent blood work including prolactin which was marginally elevated at 21 (normal 4-15). -Patient denies any history of head injury, denies any visual field deficit. -He is reporting 25 pounds of progressive weight gain over the last 2 years, has steady weight since last visit.  Review of Systems  Constitutional: no weight gain/loss, no fatigue, no subjective hyperthermia, no subjective hypothermia Eyes: no blurry vision, no xerophthalmia ENT: no sore throat, no nodules palpated in throat, no dysphagia/odynophagia, no hoarseness Cardiovascular: no Chest Pain, no Shortness of Breath, no palpitations, no leg swelling Respiratory: no cough, no SOB Gastrointestinal: no Nausea/Vomiting/Diarhhea Musculoskeletal: no muscle/joint aches Skin: no rashes Neurological: no tremors, no numbness, no tingling, no dizziness Psychiatric: no depression, no anxiety  Objective:    BP 129/72   Pulse 80   Ht  (1.803 m)   Wt 204 lb (92.5 kg)   BMI 28.45 kg/m   Wt Readings from Last 3 Encounters:  09/15/16 204 lb (92.5 kg)  05/01/16 223 lb (101.2 kg)  04/02/16 224 lb (101.6 kg)    Physical Exam  Constitutional: Significantly over weight for  hight, not in acute distress, normal state of mind Eyes: PERRLA, EOMI, no exophthalmos ENT: moist mucous membranes, no thyromegaly, no cervical lymphadenopathy Cardiovascular: normal precordial activity, Regular Rate and Rhythm, no Murmur/Rubs/Gallops Respiratory:  adequate breathing efforts, no gross chest deformity, Clear to auscultation bilaterally Gastrointestinal: abdomen soft, Non -tender, No distension, Bowel Sounds  present Musculoskeletal: no gross deformities, strength intact in all four extremities Skin:   He has bilateral gynecomastia, with prominent 4 cm left subareolar lump , no galactorrhea. He has extensive tattoos, no acute rashes Neurological: no tremor with outstretched hands, Deep tendon reflexes normal in all four extremities. Recent Results (from the past 2160 hour(s))  TSH     Status: None   Collection Time: 08/04/16 12:01 PM  Result Value Ref Range   TSH 1.82 0.40 - 4.50 mIU/L  T4, free     Status: None   Collection Time: 08/04/16 12:01 PM  Result Value Ref Range   Free T4 1.3 0.8 - 1.8 ng/dL  Prolactin     Status: Abnormal   Collection Time: 08/04/16 12:01 PM  Result Value Ref Range   Prolactin <1.0 (L) 2.0 - 18.0 ng/mL     Every 20 05/24/2016 prolactin level was 21 (normal 4-52)  Mammogram 1 04/28/2016 was reviewed to be not suggestive of malignancy.  Assessment & Plan:   1. Hyperprolactinemia (HCC) 2. Gynecomastia 3. Hypothyroidism   This patient has clinically significant gynecomastia,  breast ultrasound was negative for any mass lesion. He has responded to Cabergoline  Therapy with prolactin level<1, I advised him to discontinue cabergoline. He will need repeat labs for prolactin levels in 6 months. There is no immediate needs to do pituitary/sella imaging at this time.  - Cabergoline therapy is not expected to to reverse gynecomastia. He will have repeat breast ultrasound, if he still has significant tissue ,  he will be considered for surgery referral.  For hypothyroidism, he is on Synthroid 75 g by mouth every morning. -His thyroid function tests are consistent with appropriate replacement.  - We discussed about correct intake of levothyroxine, at fasting, with water, separated by at least 30 minutes from breakfast, and separated by more than 4 hours from calcium, iron, multivitamins, acid reflux medications (PPIs). -Patient is made aware of the fact that thyroid  hormone replacement is needed for life, dose to be adjusted by periodic monitoring of thyroid function tests. - I advised patient to maintain close follow up with Ignatius SpeckingVyas, Dhruv B, MD for primary care needs.  Follow up plan: Return in about 7 months (around 04/15/2017) for follow up with pre-visit labs.  Marquis LunchGebre Caprisha Bridgett, MD Phone: [phone]  Fax: [phone]  This note was partially dictated with voice recognition software. Similar sounding words can be transcribed inadequately or may not  be corrected upon review.  09/15/2016, 5:20 PM

## 2016-10-15 ENCOUNTER — Other Ambulatory Visit: Payer: Self-pay | Admitting: "Endocrinology

## 2016-10-20 ENCOUNTER — Ambulatory Visit (INDEPENDENT_AMBULATORY_CARE_PROVIDER_SITE_OTHER): Payer: BLUE CROSS/BLUE SHIELD | Admitting: Internal Medicine

## 2016-10-21 ENCOUNTER — Encounter (INDEPENDENT_AMBULATORY_CARE_PROVIDER_SITE_OTHER): Payer: Self-pay | Admitting: Internal Medicine

## 2016-11-05 ENCOUNTER — Encounter (INDEPENDENT_AMBULATORY_CARE_PROVIDER_SITE_OTHER): Payer: Self-pay | Admitting: *Deleted

## 2016-11-05 ENCOUNTER — Ambulatory Visit (INDEPENDENT_AMBULATORY_CARE_PROVIDER_SITE_OTHER): Payer: BLUE CROSS/BLUE SHIELD | Admitting: Internal Medicine

## 2016-11-05 ENCOUNTER — Encounter (INDEPENDENT_AMBULATORY_CARE_PROVIDER_SITE_OTHER): Payer: Self-pay | Admitting: Internal Medicine

## 2016-11-05 VITALS — BP 132/78 | HR 64 | Temp 98.1°F | Ht 71.0 in | Wt 204.6 lb

## 2016-11-05 DIAGNOSIS — B182 Chronic viral hepatitis C: Secondary | ICD-10-CM

## 2016-11-05 NOTE — Progress Notes (Signed)
   Subjective:    Patient ID: Daniel SpangleZachary Burch, male    DOB: 07/03/1985, 31 y.o.   MRN: [id number]  HPI Here today for f/u. Hx of Hepatitis C. Last seen in December of 2017.  Treated with Epclusa x 12 weeks.  He cleared the virus.  Maintained on Suboxone. Multiple tattoos.  Genotype 3.  He has been doing good. Going to school and working. His appetite is good. No weight loss. He is doing well.   Hepatitis A and B He states [facility]  Review of Systems Past Medical History:  Diagnosis Date  . Anxiety   . Essential hypertension 12/23/2015  . Hepatitis C   . Hypothyroid     Past Surgical History:  Procedure Laterality Date  . none      No Known Allergies  Current Outpatient Prescriptions on File Prior to Visit  Medication Sig Dispense Refill  . buprenorphine-naloxone (SUBOXONE) 8-2 MG SUBL SL tablet Place 1 tablet under the tongue daily.    . cabergoline (DOSTINEX) 0.5 MG tablet TAKE 1/2 TABLET BY MOUTH TWICE A WEEK 10 tablet 1  . levothyroxine (SYNTHROID, LEVOTHROID) 75 MCG tablet Take 1 tablet (75 mcg total) by mouth daily before breakfast. 30 tablet 6  . sertraline (ZOLOFT) 100 MG tablet Take 100 mg by mouth daily.    . Sofosbuvir-Velpatasvir (EPCLUSA PO) Take by mouth.     No current facility-administered medications on file prior to visit.         Objective:   Physical Exam Blood pressure 132/78, pulse 64, temperature 98.1 F (36.7 C), height 5\' 11"  (1.803 m), weight 204 lb 9.6 oz (92.8 kg). Alert and oriented. Skin warm and dry. Oral mucosa is moist.   . Sclera anicteric, conjunctivae is pink. Thyroid not enlarged. No cervical lymphadenopathy. Lungs clear. Heart regular rate and rhythm.  Abdomen is soft. Bowel sounds are positive. No hepatomegaly. No abdominal masses felt. No tenderness.  No edema to lower extremities.            Assessment & Plan:  Hepatitis C. CBC, Hepatic, and Hep C quaint. US abdomen. OV in 1  year

## 2016-11-05 NOTE — Patient Instructions (Signed)
OV in 1 year.  

## 2016-11-06 LAB — CBC WITH DIFFERENTIAL/PLATELET
BASOS PCT: 0.6 %
Basophils Absolute: 42 cells/uL (ref 0–200)
EOS ABS: 140 {cells}/uL (ref 15–500)
EOS PCT: 2 %
HCT: 43.2 % (ref 38.5–50.0)
HEMOGLOBIN: 14.2 g/dL (ref 13.2–17.1)
Lymphs Abs: 2408 cells/uL (ref 850–3900)
MCH: 27.7 pg (ref 27.0–33.0)
MCHC: 32.9 g/dL (ref 32.0–36.0)
MCV: 84.4 fL (ref 80.0–100.0)
MONOS PCT: 6.6 %
MPV: 10 fL (ref 7.5–12.5)
NEUTROS ABS: 3948 {cells}/uL (ref 1500–7800)
Neutrophils Relative %: 56.4 %
PLATELETS: 231 10*3/uL (ref 140–400)
RBC: 5.12 10*6/uL (ref 4.20–5.80)
RDW: 12.7 % (ref 11.0–15.0)
TOTAL LYMPHOCYTE: 34.4 %
WBC mixed population: 462 cells/uL (ref 200–950)
WBC: 7 10*3/uL (ref 3.8–10.8)

## 2016-11-06 LAB — HEPATIC FUNCTION PANEL
AG Ratio: 1.6 (calc) (ref 1.0–2.5)
ALKALINE PHOSPHATASE (APISO): 56 U/L (ref 40–115)
ALT: 43 U/L (ref 9–46)
AST: 27 U/L (ref 10–40)
Albumin: 4.2 g/dL (ref 3.6–5.1)
BILIRUBIN INDIRECT: 0.3 mg/dL (ref 0.2–1.2)
Bilirubin, Direct: 0.1 mg/dL (ref 0.0–0.2)
GLOBULIN: 2.7 g/dL (ref 1.9–3.7)
TOTAL PROTEIN: 6.9 g/dL (ref 6.1–8.1)
Total Bilirubin: 0.4 mg/dL (ref 0.2–1.2)

## 2016-11-06 LAB — HEPATITIS C RNA QUANTITATIVE
HCV QUANT LOG: NOT DETECTED {Log_IU}/mL
HCV RNA, PCR, QN: <15 IU/mL

## 2016-11-10 ENCOUNTER — Ambulatory Visit (HOSPITAL_COMMUNITY): Payer: BLUE CROSS/BLUE SHIELD

## 2016-11-12 ENCOUNTER — Ambulatory Visit (HOSPITAL_COMMUNITY)
Admission: RE | Admit: 2016-11-12 | Discharge: 2016-11-12 | Disposition: A | Discharge status: Home / Self Care | Payer: BLUE CROSS/BLUE SHIELD | Source: Ambulatory Visit | Attending: Internal Medicine | Admitting: Internal Medicine

## 2016-11-12 DIAGNOSIS — B182 Chronic viral hepatitis C: Secondary | ICD-10-CM | POA: Diagnosis present

## 2016-11-16 NOTE — Progress Notes (Signed)
Patient already has a one year follow up appointment scheduled for 11/08/17 at 10:15am.

## 2017-03-22 ENCOUNTER — Other Ambulatory Visit: Payer: Self-pay | Admitting: "Endocrinology

## 2017-03-22 DIAGNOSIS — IMO0002 Reserved for concepts with insufficient information to code with codable children: Secondary | ICD-10-CM

## 2017-03-22 DIAGNOSIS — R229 Localized swelling, mass and lump, unspecified: Principal | ICD-10-CM

## 2017-04-15 ENCOUNTER — Ambulatory Visit: Payer: BLUE CROSS/BLUE SHIELD | Admitting: "Endocrinology

## 2017-04-15 ENCOUNTER — Encounter: Payer: Self-pay | Admitting: "Endocrinology

## 2017-04-20 ENCOUNTER — Encounter (HOSPITAL_COMMUNITY): Payer: Self-pay

## 2017-04-20 ENCOUNTER — Encounter (HOSPITAL_COMMUNITY): Payer: BLUE CROSS/BLUE SHIELD

## 2017-08-25 ENCOUNTER — Ambulatory Visit (HOSPITAL_COMMUNITY): Payer: BLUE CROSS/BLUE SHIELD

## 2017-08-25 ENCOUNTER — Other Ambulatory Visit (HOSPITAL_COMMUNITY): Payer: Self-pay | Admitting: Nurse Practitioner

## 2017-08-25 DIAGNOSIS — N503 Cyst of epididymis: Secondary | ICD-10-CM

## 2017-08-26 ENCOUNTER — Ambulatory Visit (HOSPITAL_COMMUNITY)
Admission: RE | Admit: 2017-08-26 | Discharge: 2017-08-26 | Disposition: A | Discharge status: Home / Self Care | Payer: BLUE CROSS/BLUE SHIELD | Source: Ambulatory Visit | Attending: Nurse Practitioner | Admitting: Nurse Practitioner

## 2017-08-26 DIAGNOSIS — N503 Cyst of epididymis: Secondary | ICD-10-CM | POA: Diagnosis not present

## 2017-10-28 ENCOUNTER — Other Ambulatory Visit: Payer: Self-pay

## 2017-10-28 ENCOUNTER — Emergency Department (HOSPITAL_COMMUNITY)
Admission: EM | Admit: 2017-10-28 | Discharge: 2017-10-28 | Disposition: A | Discharge status: Home / Self Care | Payer: BLUE CROSS/BLUE SHIELD | Attending: Emergency Medicine | Admitting: Emergency Medicine

## 2017-10-28 ENCOUNTER — Encounter (HOSPITAL_COMMUNITY): Payer: Self-pay

## 2017-10-28 DIAGNOSIS — I10 Essential (primary) hypertension: Secondary | ICD-10-CM | POA: Insufficient documentation

## 2017-10-28 DIAGNOSIS — F419 Anxiety disorder, unspecified: Secondary | ICD-10-CM

## 2017-10-28 DIAGNOSIS — Z79899 Other long term (current) drug therapy: Secondary | ICD-10-CM | POA: Insufficient documentation

## 2017-10-28 DIAGNOSIS — E039 Hypothyroidism, unspecified: Secondary | ICD-10-CM | POA: Insufficient documentation

## 2017-10-28 MED ORDER — ALPRAZOLAM 0.5 MG PO TABS
1.0000 mg | ORAL_TABLET | Freq: Once | ORAL | Status: AC
  Administered 2017-10-28: 1 mg via ORAL
  Filled 2017-10-28: qty 2

## 2017-10-28 NOTE — ED Provider Notes (Signed)
[facility] EMERGENCY DEPARTMENT Provider Note   CSN: [id number] Arrival date & time: 10/28/17  4098     History   Chief Complaint Chief Complaint  Patient presents with  . medication refill    HPI Daniel Burch is a 32 y.o. male.  HPI  Daniel Burch is a 32 y.o. male who presents to the Emergency Department requesting refill of his 1 mg Xanax.  He states that he lives in [place] part of the year and usually gets his prescription filled there.  He takes 1 mg Xanax 3 times daily for anxiety.  His last dose was in the early morning hours this morning.  He reports feeling anxious and has a frontal headache of gradual onset.  No neck pain or stiffness. He denies seizure, dizziness or chest pain.  He states that he leaves for [place] in 5 days and is requesting enough medication until he gets there.  He states that his friend is an anesthesiologist in the evening and he was advised to go to the emergency room for his medication refill.     Past Medical History:  Diagnosis Date  . Anxiety   . Essential hypertension 12/23/2015  . Hepatitis C   . Hypothyroid     Patient Active Problem List   Diagnosis Date Noted  . Hyperprolactinemia (HCC) 04/02/2016  . Gynecomastia 04/02/2016  . Essential hypertension 12/23/2015  . Hypothyroidism 12/23/2015  . Hepatitis C antibody test positive 08/16/2013  . Abnormal LFTs 08/16/2013    Past Surgical History:  Procedure Laterality Date  . none        Home Medications    Prior to Admission medications   Medication Sig Start Date End Date Taking? Authorizing Provider  buprenorphine-naloxone (SUBOXONE) 8-2 MG SUBL SL tablet Place 1 tablet under the tongue daily.    [provider]  levothyroxine (SYNTHROID, LEVOTHROID) 75 MCG tablet Take 1 tablet (75 mcg total) by mouth daily before breakfast. 09/15/16   Nida, Denman George, MD  sertraline (ZOLOFT) 100 MG tablet Take 100 mg by mouth daily.    [provider]    Family  History Family History  Problem Relation Age of Onset  . Liver disease Unknown   . Colon cancer Neg Hx     Social History Social History   Tobacco Use  . Smoking status: Never Smoker  . Smokeless tobacco: Never Used  Substance Use Topics  . Alcohol use: No  . 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   Review of Systems Review of Systems  Constitutional: Negative for activity change, appetite change, chills and fever.  Eyes: Negative for visual disturbance.  Respiratory: Negative for cough, shortness of breath, wheezing and stridor.   Cardiovascular: Negative for chest pain.  Gastrointestinal: Negative for abdominal pain, nausea and vomiting.  Musculoskeletal: Negative for neck pain and neck stiffness.  Skin: Negative for rash.  Neurological: Positive for headaches. Negative for dizziness, seizures, syncope and weakness.  Hematological: Negative for adenopathy.  Psychiatric/Behavioral: Negative for agitation and confusion. The patient is nervous/anxious.      Physical Exam Updated Vital Signs BP (!) 151/97 (BP Location: Left Arm)   Pulse 86   Temp (!) 97.5 F (36.4 C) (Oral)   Resp 16   Ht 5\' 11"  (1.803 m)   Wt 89.8 kg   SpO2 99%   BMI 27.62 kg/m   Physical Exam  Constitutional: He is oriented to person, place, and time. He appears well-nourished. He is cooperative. He does not appear ill. No distress.  HENT:  Mouth/Throat: Oropharynx is clear and moist.  Eyes: Pupils are equal, round, and reactive to light. EOM are normal.  Neck: Normal range of motion. No thyromegaly present.  Cardiovascular: Normal rate and regular rhythm.  Pulmonary/Chest: Effort normal and breath sounds normal. No respiratory distress.  Musculoskeletal: Normal range of motion.  Neurological: He is alert and oriented to person, place, and time. No sensory deficit. Coordination normal.  Skin: Skin is  warm.  Psychiatric: He has a normal mood and affect. His speech is normal and behavior is normal. Judgment normal. Thought content is not delusional. He expresses no homicidal and no suicidal ideation.  Nursing note and vitals reviewed.    ED Treatments / Results  Labs (all labs ordered are listed, but only abnormal results are displayed) Labs Reviewed - No data to display  EKG None  Radiology No results found.  Procedures Procedures (including critical care time)  Medications Ordered in ED Medications  ALPRAZolam (XANAX) tablet 1 mg (has no administration in time range)     Initial Impression / Assessment and Plan / ED Course  I have reviewed the triage vital signs and the nursing notes.  Pertinent labs & imaging results that were available during my care of the patient were reviewed by me and considered in my medical decision making (see chart for details).     Review of previous records, it was noted that patient seen in our system previously for substance abuse.  On review of the narcotic database, it was noted that patient received prescription for alprazolam 1 mg tablets on 10/25/2017 for quantity of 12.  He initially told me that he only gets his Xanax filled in [place].  He is also recently been prescribed ZUBSOLV.  On exam he is mildly anxious but no concerning symptoms for seizure or active withdrawal.  Patient understands that he will be given medication here but no further prescription will be written.  He appear stable for d/c.  referral list provided.    Final Clinical Impressions(s) / ED Diagnoses   Final diagnoses:  Anxiety    ED Discharge Orders    None       Pauline Aus, PA-C 10/28/17 1034    Raeford Razor, MD 10/28/17 1238

## 2017-10-28 NOTE — Discharge Instructions (Addendum)
Follow-up with your primary provider.    [facility] Primary Care Doctor List    Kari Baars MD. Specialty: Pulmonary Disease Contact information: [address]  [address]  [place] [address]  [phone]   Syliva Overman, MD. Specialty: [facility] Medicine Contact information: [address]  [place] [address]  [phone]   Lilyan Punt, MD. Specialty: [facility] Medicine Contact information: [address]  [place] [address]  [phone]   Avon Gully, MD Specialty: Internal Medicine Contact information: [address] [place] [address]  [phone]   Catalina Pizza, MD. Specialty: Internal Medicine Contact information: [address]  [place] [address]  [phone]    [facility] (Dr. Selena Batten) Specialty: Family Medicine Contact information: [address]  [place] [address]  [phone]   John Giovanni, MD. Specialty: [facility] Medicine Contact information: [address]  [address]  [place] [address]  [phone]   Carylon Perches, MD. Specialty: Internal Medicine Contact information: [address]  [address]  [place] [address]  [phone]    [facility]  [address] [place], [place] [address] [phone]  Services [facility] offers a variety of basic health services.  Services include but are not limited to: Blood pressure checks  Heart rate checks  Blood sugar checks  Urine analysis  Rapid strep tests  Pregnancy tests.  Health education and referrals  People needing more complex services will be directed to a physician online. Using these virtual visits, doctors can evaluate and prescribe medicine and treatments. There will be no medication on-site, though [facility] will help patients fill their prescriptions at little to no cost.   For More information please go  to: [email or web address]

## 2017-10-28 NOTE — ED Triage Notes (Signed)
Pt reports he lives in [place] part of the year and gets xanax prescription there.  Reports says he took his last one early this morning and is requesting enough medication until he can get to his doctor in [place].   Pt says he has been spacing them out to prevent running out.  Pt c/o headache.

## 2017-10-31 ENCOUNTER — Ambulatory Visit (HOSPITAL_COMMUNITY)
Admission: EM | Admit: 2017-10-31 | Discharge: 2017-10-31 | Disposition: A | Discharge status: Home / Self Care | Payer: BLUE CROSS/BLUE SHIELD | Attending: Family Medicine | Admitting: Family Medicine

## 2017-10-31 ENCOUNTER — Encounter (HOSPITAL_COMMUNITY): Payer: Self-pay

## 2017-10-31 DIAGNOSIS — F1911 Other psychoactive substance abuse, in remission: Secondary | ICD-10-CM

## 2017-10-31 DIAGNOSIS — F1320 Sedative, hypnotic or anxiolytic dependence, uncomplicated: Secondary | ICD-10-CM

## 2017-10-31 NOTE — ED Notes (Signed)
Pt left prior to receiving discharge papers.

## 2017-10-31 NOTE — ED Provider Notes (Signed)
[facility]    CSN: [id number] Arrival date & time: 10/31/17  1712     History   Chief Complaint Chief Complaint  Patient presents with  . Medication Refill  . Headache    HPI Daniel Burch is a 32 y.o. male.   HPI  Patient is here requesting a refill of Xanax.  He states he is going to [place] in 5 days.  He states he needs enough pills to last him until he leaves.  He states once he gets to [place] he has a refillable prescription that he can pick up.  He has a provider that gives him Xanax 1 mg 3 times daily.  He states he goes there almost every year. I noticed in the medical record that he has received 30 Xanax in the last several days.  2 different providers.  2 different pharmacies.  He should have enough to last him for 2 or 3 more days. Patient is a substance abuser.  He is on Suboxone.  Has been on this for many years.  He sees a primary care doctor for his Suboxone.  It is uncertain whether the primary care doctor realizes he is also on Xanax.  I looked in the primary care doctor's notes and it is not indicated in his notes or in his medication list.  The patient states he is told his primary care doctor in the past. In the primary care doctor's note on 10/06/2017 patient refused to do a urine drug screen.  He told the doctor he had done IV drugs the night before.  It seems as though his recovery is sketchy. Interestingly, patient states he is getting a masters in substance abuse counseling.  Past Medical History:  Diagnosis Date  . Anxiety   . Essential hypertension 12/23/2015  . Hepatitis C   . Hypothyroid     Patient Active Problem List   Diagnosis Date Noted  . Hyperprolactinemia (HCC) 04/02/2016  . Gynecomastia 04/02/2016  . Essential hypertension 12/23/2015  . Hypothyroidism 12/23/2015  . Hepatitis C antibody test positive 08/16/2013  . Abnormal LFTs 08/16/2013    Past Surgical History:  Procedure Laterality Date  . none         Home  Medications    Prior to Admission medications   Medication Sig Start Date End Date Taking? Authorizing Provider  ALPRAZolam Prudy Feeler) 0.5 MG tablet Take 0.5 mg by mouth at bedtime as needed for anxiety.   Yes [provider]  buprenorphine-naloxone (SUBOXONE) 8-2 MG SUBL SL tablet Place 1 tablet under the tongue daily.    [provider]  levothyroxine (SYNTHROID, LEVOTHROID) 75 MCG tablet Take 1 tablet (75 mcg total) by mouth daily before breakfast. 09/15/16   Nida, Denman George, MD  sertraline (ZOLOFT) 100 MG tablet Take 100 mg by mouth daily.    [provider]    Family History Family History  Problem Relation Age of Onset  . Liver disease Unknown   . Colon cancer Neg Hx     Social History Social History   Tobacco Use  . Smoking status: Never Smoker  . Smokeless tobacco: Never Used  Substance Use Topics  . Alcohol use: No  . 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   Review of Systems Review of Systems  Constitutional: Negative for chills and fever.  HENT: Negative for ear pain and sore throat.  Eyes: Negative for pain and visual disturbance.  Respiratory: Negative for cough and shortness of breath.   Cardiovascular: Negative for chest pain and palpitations.  Gastrointestinal: Negative for abdominal pain and vomiting.  Genitourinary: Negative for dysuria and hematuria.  Musculoskeletal: Negative for arthralgias and back pain.  Skin: Negative for color change and rash.  Neurological: Negative for seizures and syncope.       Patient noted headache when he registered to be seen, he tells me he has no headache today.  Psychiatric/Behavioral:       Patient endorses chronic anxiety disorder.  All other systems reviewed and are negative.    Physical Exam Triage Vital Signs ED Triage Vitals [10/31/17 1738]  Enc Vitals Group     BP (!) 155/96     Pulse  Rate 79     Resp 20     Temp 97.9 F (36.6 C)     Temp Source Oral     SpO2 98 %     Weight      Height      Head Circumference      Peak Flow      Pain Score 6     Pain Loc      Pain Edu?      Excl. in GC?    No data found.  Updated Vital Signs BP (!) 155/96 (BP Location: Right Arm)   Pulse 79   Temp 97.9 F (36.6 C) (Oral)   Resp 20   SpO2 98%   Physical Exam  Constitutional: He appears well-developed and well-nourished. No distress.  Well-dressed and well-groomed  HENT:  Head: Normocephalic and atraumatic.  Mouth/Throat: Oropharynx is clear and moist.  Eyes: Pupils are equal, round, and reactive to light. Conjunctivae are normal.  Neck: Normal range of motion.  Cardiovascular: Normal rate.  Pulmonary/Chest: Effort normal. No respiratory distress.  Abdominal: Soft. He exhibits no distension.  Musculoskeletal: Normal range of motion. He exhibits no edema.  Neurological: He is alert.  Skin: Skin is warm and dry.  Psychiatric: He has a normal mood and affect. His behavior is normal.  Patient became upset and left the office without his discharge information when I explained to him that I was not refilling his Xanax     UC Treatments / Results  Labs (all labs ordered are listed, but only abnormal results are displayed) Labs Reviewed - No data to display  EKG None  Radiology No results found.  Procedures Procedures (including critical care time)  Medications Ordered in UC Medications - No data to display  Initial Impression / Assessment and Plan / UC Course  I have reviewed the triage vital signs and the nursing notes.  Pertinent labs & imaging results that were available during my care of the patient were reviewed by me and considered in my medical decision making (see chart for details).     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  In addition he should recognize that to getting controlled substances from 3 different providers and oh 1 week.  Of time, and utilizing more than one pharmacy or red flags for improper use.  In addition it is unclear whether his primary care provider is aware he is using the Xanax.  I told him that, in my opinion, he has a benzodiazepine dependence disorder.  I told him that I will  not give him any Xanax from this office.  In order not to go into withdrawal, he should not stop suddenly but should reduce to 1 pill twice a day until he can be seen by his other prescriber. Final Clinical Impressions(s) / UC Diagnoses   Final diagnoses:  Benzodiazepine dependence, continuous (HCC)  Substance abuse in remission [facility])     Discharge Instructions     Take the alprazolam 2 x a day until you get to your usual provider We will not give you benzodiazepine medicines at this office   ED Prescriptions    None     Controlled Substance Prescriptions [place] Controlled Substance Registry consulted? Yes, I have consulted the [place] Controlled Substances Registry for this patient, and feel the risk/benefit ratio today is favorable for proceeding with this prescription for a controlled substance.   Eustace Moore, MD 10/31/17 [phone]

## 2017-10-31 NOTE — Discharge Instructions (Signed)
Take the alprazolam 2 x a day until you get to your usual provider We will not give you benzodiazepine medicines at this office

## 2017-10-31 NOTE — ED Triage Notes (Signed)
Pt presents with headache and needing a medication refill for xanax.

## 2017-11-08 ENCOUNTER — Encounter (INDEPENDENT_AMBULATORY_CARE_PROVIDER_SITE_OTHER): Payer: Self-pay | Admitting: Internal Medicine

## 2017-11-08 ENCOUNTER — Ambulatory Visit (INDEPENDENT_AMBULATORY_CARE_PROVIDER_SITE_OTHER): Payer: BLUE CROSS/BLUE SHIELD | Admitting: Internal Medicine

## 2017-12-21 ENCOUNTER — Other Ambulatory Visit: Payer: Self-pay | Admitting: Urology

## 2017-12-21 ENCOUNTER — Encounter (HOSPITAL_COMMUNITY): Payer: Self-pay | Admitting: *Deleted

## 2018-01-04 NOTE — Patient Instructions (Addendum)
Hosie SpangleZachary Turvey  01/04/2018   Your procedure is scheduled on: Friday 01/07/2018  [facility]              Report to admitting at  0530  AM    Call this number if you have problems the morning of surgery [phone]    Remember: Do not eat food or drink liquids :After Midnight.              BRUSH YOUR TEETH MORNING OF SURGERY AND RINSE YOUR MOUTH OUT, NO CHEWING GUM CANDY OR MINTS.     Take these medicines the morning of surgery with A SIP OF WATER: Sertraline (Zoloft), Levothyroxine (Synthroid)                                 You may not have any metal on your body including hair pins and              piercings  Do not wear jewelry, make-up, lotions, powders or perfumes, deodorant                      Men may shave face and neck.   Do not bring valuables to the hospital. [place] IS NOT             RESPONSIBLE   FOR VALUABLES.  Contacts, dentures or bridgework may not be worn into surgery.  Leave suitcase in the car. After surgery it may be brought to your room.     Patients discharged the day of surgery will not be allowed to drive home.             If taking an [employer] or Taxi to and from the hospital on the day of surgery, you must have a responsible adult to be with you !   Name and phone number of your driver: father-Tim Laughridge               Please read over the following fact sheets you were given: _____________________________________________________________________             [facility] - Preparing for [facility] Before surgery, you can play an important role.  Because skin is not sterile, your skin needs to be as free of germs as possible.  You can reduce the number of germs on your skin by washing with CHG (chlorahexidine gluconate) soap before surgery.  CHG is an antiseptic cleaner which kills germs and bonds with the skin to continue killing germs even after washing. Please DO NOT use if you have an allergy to  CHG or antibacterial soaps.  If your skin becomes reddened/irritated stop using the CHG and inform your nurse when you arrive at Short Stay. Do not shave (including legs and underarms) for at least 48 hours prior to the first CHG shower.  You may shave your face/neck. Please follow these instructions carefully:  1.  Shower with CHG Soap the night before surgery and the  morning of Surgery.  2.  If you choose to wash your hair, wash your hair first as usual with your  normal  shampoo.  3.  After you shampoo, rinse your hair and body thoroughly to remove the  shampoo.  4.  Use CHG as you would any other liquid soap.  You can apply chg directly  to the skin and wash                       Gently with a scrungie or clean washcloth.  5.  Apply the CHG Soap to your body ONLY FROM THE NECK DOWN.   Do not use on face/ open                           Wound or open sores. Avoid contact with eyes, ears mouth and genitals (private parts).                       Wash face,  Genitals (private parts) with your normal soap.             6.  Wash thoroughly, paying special attention to the area where your surgery  will be performed.  7.  Thoroughly rinse your body with warm water from the neck down.  8.  DO NOT shower/wash with your normal soap after using and rinsing off  the CHG Soap.                9.  Pat yourself dry with a clean towel.            10.  Wear clean pajamas.            11.  Place clean sheets on your bed the night of your first shower and do not  sleep with pets. Day of Surgery : Do not apply any lotions/deodorants the morning of surgery.  Please wear clean clothes to the hospital/surgery center.  FAILURE TO FOLLOW THESE INSTRUCTIONS MAY RESULT IN THE CANCELLATION OF YOUR SURGERY PATIENT SIGNATURE_________________________________  NURSE SIGNATURE__________________________________  ________________________________________________________________________

## 2018-01-06 ENCOUNTER — Other Ambulatory Visit: Payer: Self-pay | Admitting: Urology

## 2018-01-06 ENCOUNTER — Encounter (HOSPITAL_COMMUNITY)
Admission: RE | Admit: 2018-01-06 | Discharge: 2018-01-06 | Disposition: A | Discharge status: Home / Self Care | Payer: BLUE CROSS/BLUE SHIELD | Source: Ambulatory Visit | Attending: Urology | Admitting: Urology

## 2018-01-06 ENCOUNTER — Other Ambulatory Visit: Payer: Self-pay

## 2018-01-06 ENCOUNTER — Encounter (HOSPITAL_COMMUNITY): Payer: Self-pay

## 2018-01-06 DIAGNOSIS — N509 Disorder of male genital organs, unspecified: Secondary | ICD-10-CM | POA: Insufficient documentation

## 2018-01-06 DIAGNOSIS — I10 Essential (primary) hypertension: Secondary | ICD-10-CM | POA: Diagnosis not present

## 2018-01-06 DIAGNOSIS — Z01818 Encounter for other preprocedural examination: Secondary | ICD-10-CM | POA: Diagnosis not present

## 2018-01-06 DIAGNOSIS — R9431 Abnormal electrocardiogram [ECG] [EKG]: Secondary | ICD-10-CM | POA: Diagnosis not present

## 2018-01-06 HISTORY — DX: Other psychoactive substance abuse, uncomplicated: F19.10

## 2018-01-06 LAB — COMPREHENSIVE METABOLIC PANEL
ALT: 78 U/L — ABNORMAL HIGH (ref 0–44)
AST: 30 U/L (ref 15–41)
Albumin: 4.3 g/dL (ref 3.5–5.0)
Alkaline Phosphatase: 71 U/L (ref 38–126)
Anion gap: 9 (ref 5–15)
BUN: 16 mg/dL (ref 6–20)
CHLORIDE: 105 mmol/L (ref 98–111)
CO2: 27 mmol/L (ref 22–32)
CREATININE: 0.94 mg/dL (ref 0.61–1.24)
Calcium: 9.1 mg/dL (ref 8.9–10.3)
GFR calc Af Amer: >60 mL/min (ref >60)
Glucose, Bld: 93 mg/dL (ref 70–99)
Potassium: 4 mmol/L (ref 3.5–5.1)
Sodium: 141 mmol/L (ref 135–145)
Total Bilirubin: 0.9 mg/dL (ref 0.3–1.2)
Total Protein: 7.5 g/dL (ref 6.5–8.1)

## 2018-01-06 LAB — CBC
HCT: 45.7 % (ref 39.0–52.0)
Hemoglobin: 14.6 g/dL (ref 13.0–17.0)
MCH: 27.3 pg (ref 26.0–34.0)
MCHC: 31.9 g/dL (ref 30.0–36.0)
MCV: 85.4 fL (ref 80.0–100.0)
Platelets: 355 10*3/uL (ref 150–400)
RBC: 5.35 MIL/uL (ref 4.22–5.81)
RDW: 14.2 % (ref 11.5–15.5)
WBC: 8.3 10*3/uL (ref 4.0–10.5)
nRBC: 0 % (ref 0.0–0.2)

## 2018-01-06 NOTE — Anesthesia Preprocedure Evaluation (Addendum)
Anesthesia Evaluation  Patient identified by MRN, date of birth, ID band Patient awake    Reviewed: Allergy & Precautions, NPO status , Patient's Chart, lab work & pertinent test results  Airway Mallampati: II  TM Distance: >3 FB Neck ROM: Full    Dental  (+) Dental Advisory Given   Pulmonary neg pulmonary ROS,    breath sounds clear to auscultation       Cardiovascular hypertension, Pt. on medications  Rhythm:Regular Rate:Normal     Neuro/Psych negative neurological ROS     GI/Hepatic negative GI ROS, (+)     substance abuse (on suboxone)  , Hepatitis -, C  Endo/Other  Hypothyroidism   Renal/GU negative Renal ROS     Musculoskeletal  (+) narcotic dependent  Abdominal   Peds  Hematology negative hematology ROS (+)   Anesthesia Other Findings   Reproductive/Obstetrics                           Lab Results  Component Value Date   WBC 8.3 01/06/2018   HGB 14.6 01/06/2018   HCT 45.7 01/06/2018   MCV 85.4 01/06/2018   PLT 355 01/06/2018   Lab Results  Component Value Date   CREATININE 0.94 01/06/2018   BUN 16 01/06/2018   NA 141 01/06/2018   K 4.0 01/06/2018   CL 105 01/06/2018   CO2 27 01/06/2018    Anesthesia Physical Anesthesia Plan  ASA: II  Anesthesia Plan: General   Post-op Pain Management:    Induction: Intravenous  PONV Risk Score and Plan: 2 and Dexamethasone, Ondansetron and Treatment may vary due to age or medical condition  Airway Management Planned: LMA and Oral ETT  Additional Equipment:   Intra-op Plan:   Post-operative Plan: Extubation in OR  Informed Consent: I have reviewed the patients History and Physical, chart, labs and discussed the procedure including the risks, benefits and alternatives for the proposed anesthesia with the patient or authorized representative who has indicated his/her understanding and acceptance.   Dental advisory  given  Plan Discussed with: CRNA  Anesthesia Plan Comments:      Anesthesia Quick Evaluation

## 2018-01-06 NOTE — Progress Notes (Signed)
Consulted Dr. Richardson Landry, MDA via phone about patient's BP at pre-op appointment being 166/101 and     . Patient has a history of hypertension but has been on no medications for a while. In office notes from his substance abuse Physician his blood pressure has been borderline. Per Dr. Richardson Landry, patient to follow back up with PCP about blood pressure.

## 2018-01-06 NOTE — H&P (Signed)
HPI: Daniel Burch is a 33 year-old male with a right scrotal mass.  The mass is on the right side. He noticed his testicular mass for 5 months. The mass has not grown in size. He does not have testicular pain on the side of his mass. His mass does not cause restriction of normal activities. He has not had injuries to the testicles or scrotum.   He has not had scrotal surgery.   He has not recently had unwanted weight loss. He is not having new bone pain.   09/09/17: He was seen on 08/20/17 having noted a right testicular mass on self-exam. A scrotal ultrasound was performed on 08/26/17 which revealed normal testicles bilaterally with a normal right epididymis. On the left-hand side there was an 8 mm echogenic area in the head of the epididymis without blood flow read by the radiologist as normal heterogeneity versus adenomatoid tumor.  He reports that he 1st noted a knot in his scrotum about 2 months ago. During that time he has not noted any change. He said it not causing him any discomfort. He cannot recall any trauma. He has never had prostatitis or epididymitis. He has 1 son and is considering having another at some point.   12/15/17: He returns for follow-up of a right-sided scrotal lesion that he reports feels like it has increased in size slightly. He is not having any significant pain. No urinary symptoms are noted.     ALLERGIES: Nkda    MEDICATIONS: Synthroid     GU PSH: None   NON-GU PSH: None   GU PMH: Testis disorders, other noninflammatory disorders, Right, I told the patient that his ultrasound did not appear particularly concerning at all especially because the area had no blood flow but what I am feeling does not really feel like what was imaged on his ultrasound although it does feel benign I told him I honestly had no idea what this was. I am not concerned about malignancy although I do want to reimage this again in 3 months and re-examine him at that time. - 09/27/2017     NON-GU PMH: Anxiety Hypertension Inflammatory liver disease, unspecified    FAMILY HISTORY: None   SOCIAL HISTORY: Marital Status: Single Preferred Language: English; Ethnicity: Not Hispanic Or Latino; Race: White Has never drank.  Drinks 2 caffeinated drinks per day.    REVIEW OF SYSTEMS:    GU Review Male:   Patient denies frequent urination, hard to postpone urination, burning/ pain with urination, get up at night to urinate, leakage of urine, stream starts and stops, trouble starting your stream, have to strain to urinate , erection problems, and penile pain.  Gastrointestinal (Upper):   Patient denies indigestion/ heartburn, nausea, and vomiting.  Gastrointestinal (Lower):   Patient denies diarrhea and constipation.  Constitutional:   Patient denies fever, night sweats, weight loss, and fatigue.  Skin:   Patient denies skin rash/ lesion and itching.  Eyes:   Patient denies blurred vision and double vision.  Ears/ Nose/ Throat:   Patient denies sore throat and sinus problems.  Hematologic/Lymphatic:   Patient denies swollen glands and easy bruising.  Cardiovascular:   Patient denies leg swelling and chest pains.  Respiratory:   Patient denies cough and shortness of breath.  Endocrine:   Patient denies excessive thirst.  Musculoskeletal:   Patient denies back pain and joint pain.  Neurological:   Patient denies headaches and dizziness.  Psychologic:   Patient denies depression and anxiety.  VITAL SIGNS:    Weight 208 lb / 94.35 kg  Height 71 in / 180.34 cm  BP 123/72 mmHg  Pulse 80 /min  BMI 29.0 kg/m   GU PHYSICAL EXAMINATION:    Scrotum: No lesions. No edema. No cysts. No warts.  Epididymides: He has a very odd shaped structure within the right hemiscrotum with what feels like a serpiginous area anterior lateral to the testicle that is nontender and not fixed. Left: No spermatocele, no masses, no cysts, no tenderness, no induration, no enlargement.   Testes: No  tenderness, no swelling, no enlargement left testes. No tenderness, no swelling, no enlargement right testes. Normal location left testes. Normal location right testes. No mass, no cyst, no varicocele, no hydrocele left testes. No mass, no cyst, no varicocele, no hydrocele right testes.  Urethral Meatus: Normal size. No lesion, no wart, no discharge, no polyp. Normal location.  Penis: Circumcised, no warts, no cracks. No dorsal Peyronie's plaques, no left corporal Peyronie's plaques, no right corporal Peyronie's plaques, no scarring, no warts. No balanitis, no meatal stenosis.   MULTI-SYSTEM PHYSICAL EXAMINATION:    Constitutional: Well-nourished. No physical deformities. Normally developed. Good grooming.  Neck: Neck symmetrical, not swollen. Normal tracheal position.  Respiratory: No labored breathing, no use of accessory muscles.   Cardiovascular: Normal temperature, normal extremity pulses, no swelling, no varicosities.  Lymphatic: No enlargement of neck, axillae, groin.  Skin: No paleness, no jaundice, no cyanosis. No lesion, no ulcer, no rash.  Neurologic / Psychiatric: Oriented to time, oriented to place, oriented to person. No depression, no anxiety, no agitation.  Gastrointestinal: No mass, no tenderness, no rigidity, non obese abdomen.  Eyes: Normal conjunctivae. Normal eyelids.  Ears, Nose, Mouth, and Throat: Left ear no scars, no lesions, no masses. Right ear no scars, no lesions, no masses. Nose no scars, no lesions, no masses. Normal hearing. Normal lips.  Musculoskeletal: Normal gait and station of head and neck.     PAST DATA REVIEWED:  Source Of History:  Patient  Records Review:   Previous Patient Records, POC Tool  X-Ray Review: Scrotal Ultrasound: Reviewed Films. His scrotal ultrasound images from 6/19 were reviewed and compared with today's study.    PROCEDURES:         Scrotal Ultrasound - [id number]  Right Testicle: Length: 4.0 cm  Height: 1.8 cm  Width: 2.8 cm  Left  Testicle: Length: 3.9 cm  Height: 1.9 cm  Width: 2.7 cm  Left Testis/Epididymis:  Testicle seen with blood flow, calc seen = 0.10cm. Multiple cystic areas noted, largest= 0.22cm, HYperechoic area seen in epi head= 0.53x0.59x0.60cm. No varicocele seen. Hydrocele: Small amount of fluid noted.   Right Testis/Epididymis:  Testicle seen with blood flow, calcs noted within. Epi appears WNLs. Small varicocele noted= 0.35cm. Hydrocele: Small amount of fluid noted.      I independently reviewed his scrotal ultrasound. I do in the left epididymis but I do not appreciate lesion on the right-hand side and may not have been visualized on the study.   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 He          Notes:   He has a lesion within his right hemiscrotum. Although his ultrasound from the hospital indicated a left sided lesion he agrees that he has no abnormality  of the testicle or epididymis and I am in agreement with that. The lesion in question is anterior to his right testicle not his left testicle.  It has increased in size and therefore is of somewhat of a concern. Because of that I have recommended surgical exploration and excision. I went over the procedure with him in detail today. We discussed the incision used as well as the potential risks and complications. I told him that if the epididymis appears to be involved this may required epididymectomy and possibly an orchidectomy. We discussed the possible need for intraoperative frozen section evaluation. I discussed the outpatient nature of the procedure as well as the anticipated postoperative course. He understands and has elected to proceed.

## 2018-01-07 ENCOUNTER — Encounter (HOSPITAL_COMMUNITY): Payer: Self-pay | Admitting: Anesthesiology

## 2018-01-07 ENCOUNTER — Other Ambulatory Visit: Payer: Self-pay

## 2018-01-07 ENCOUNTER — Ambulatory Visit (HOSPITAL_COMMUNITY): Payer: BLUE CROSS/BLUE SHIELD | Admitting: Physician Assistant

## 2018-01-07 ENCOUNTER — Ambulatory Visit (HOSPITAL_COMMUNITY): Payer: BLUE CROSS/BLUE SHIELD | Admitting: Anesthesiology

## 2018-01-07 ENCOUNTER — Encounter (HOSPITAL_COMMUNITY)
Admission: RE | Disposition: A | Discharge status: Home / Self Care | Payer: Self-pay | Source: Home / Self Care | Attending: Urology

## 2018-01-07 ENCOUNTER — Ambulatory Visit (HOSPITAL_COMMUNITY)
Admission: RE | Admit: 2018-01-07 | Discharge: 2018-01-07 | Disposition: A | Discharge status: Home / Self Care | Payer: BLUE CROSS/BLUE SHIELD | Attending: Urology | Admitting: Urology

## 2018-01-07 DIAGNOSIS — L928 Other granulomatous disorders of the skin and subcutaneous tissue: Secondary | ICD-10-CM | POA: Diagnosis not present

## 2018-01-07 DIAGNOSIS — I10 Essential (primary) hypertension: Secondary | ICD-10-CM | POA: Diagnosis not present

## 2018-01-07 DIAGNOSIS — F1120 Opioid dependence, uncomplicated: Secondary | ICD-10-CM | POA: Diagnosis not present

## 2018-01-07 DIAGNOSIS — N5089 Other specified disorders of the male genital organs: Secondary | ICD-10-CM

## 2018-01-07 DIAGNOSIS — N509 Disorder of male genital organs, unspecified: Secondary | ICD-10-CM | POA: Insufficient documentation

## 2018-01-07 DIAGNOSIS — E039 Hypothyroidism, unspecified: Secondary | ICD-10-CM | POA: Diagnosis not present

## 2018-01-07 DIAGNOSIS — Z79899 Other long term (current) drug therapy: Secondary | ICD-10-CM | POA: Diagnosis not present

## 2018-01-07 HISTORY — PX: SCROTAL EXPLORATION: SHX2386

## 2018-01-07 SURGERY — EXPLORATION, SCROTUM
Anesthesia: General | Laterality: Right

## 2018-01-07 MED ORDER — PROMETHAZINE HCL 25 MG/ML IJ SOLN: 6.2500 mg | INTRAMUSCULAR | Status: DC | PRN

## 2018-01-07 MED ORDER — HYDROMORPHONE HCL 1 MG/ML IJ SOLN: 0.2500 mg | INTRAMUSCULAR | Status: DC | PRN

## 2018-01-07 MED ORDER — MIDAZOLAM HCL 2 MG/2ML IJ SOLN
INTRAMUSCULAR | Status: AC
  Filled 2018-01-07: qty 2

## 2018-01-07 MED ORDER — PHENYLEPHRINE 40 MCG/ML (10ML) SYRINGE FOR IV PUSH (FOR BLOOD PRESSURE SUPPORT)
PREFILLED_SYRINGE | INTRAVENOUS | Status: DC | PRN
  Administered 2018-01-07 (×3): 80 ug via INTRAVENOUS
  Administered 2018-01-07: 120 ug via INTRAVENOUS

## 2018-01-07 MED ORDER — LIDOCAINE 2% (20 MG/ML) 5 ML SYRINGE
INTRAMUSCULAR | Status: AC
  Filled 2018-01-07: qty 5

## 2018-01-07 MED ORDER — PHENYLEPHRINE 40 MCG/ML (10ML) SYRINGE FOR IV PUSH (FOR BLOOD PRESSURE SUPPORT)
PREFILLED_SYRINGE | INTRAVENOUS | Status: AC
  Filled 2018-01-07: qty 10

## 2018-01-07 MED ORDER — BUPIVACAINE HCL (PF) 0.25 % IJ SOLN
INTRAMUSCULAR | Status: AC
  Filled 2018-01-07: qty 30

## 2018-01-07 MED ORDER — HYDROCODONE-ACETAMINOPHEN 10-325 MG PO TABS: 1.0000 | ORAL_TABLET | ORAL | 0 refills | Status: DC | PRN

## 2018-01-07 MED ORDER — BUPIVACAINE-EPINEPHRINE 0.5% -1:200000 IJ SOLN
INTRAMUSCULAR | Status: DC | PRN
  Administered 2018-01-07: 30 mL

## 2018-01-07 MED ORDER — CEFAZOLIN SODIUM-DEXTROSE 2-4 GM/100ML-% IV SOLN
2.0000 g | Freq: Once | INTRAVENOUS | Status: AC
  Administered 2018-01-07: 2 g via INTRAVENOUS
  Filled 2018-01-07: qty 100

## 2018-01-07 MED ORDER — ONDANSETRON HCL 4 MG/2ML IJ SOLN
INTRAMUSCULAR | Status: AC
  Filled 2018-01-07: qty 2

## 2018-01-07 MED ORDER — ONDANSETRON HCL 4 MG/2ML IJ SOLN
INTRAMUSCULAR | Status: DC | PRN
  Administered 2018-01-07: 4 mg via INTRAVENOUS

## 2018-01-07 MED ORDER — OXYCODONE HCL 5 MG PO TABS: 5.0000 mg | ORAL_TABLET | Freq: Once | ORAL | Status: DC | PRN

## 2018-01-07 MED ORDER — BUPIVACAINE-EPINEPHRINE (PF) 0.25% -1:200000 IJ SOLN
INTRAMUSCULAR | Status: AC
  Filled 2018-01-07: qty 30

## 2018-01-07 MED ORDER — LACTATED RINGERS IV SOLN
INTRAVENOUS | Status: DC
  Administered 2018-01-07 (×2): via INTRAVENOUS

## 2018-01-07 MED ORDER — OXYCODONE HCL 5 MG/5ML PO SOLN
5.0000 mg | Freq: Once | ORAL | Status: DC | PRN
  Filled 2018-01-07: qty 5

## 2018-01-07 MED ORDER — PROPOFOL 10 MG/ML IV BOLUS
INTRAVENOUS | Status: DC | PRN
  Administered 2018-01-07: 300 mg via INTRAVENOUS

## 2018-01-07 MED ORDER — DEXAMETHASONE SODIUM PHOSPHATE 10 MG/ML IJ SOLN
INTRAMUSCULAR | Status: DC | PRN
  Administered 2018-01-07: 10 mg via INTRAVENOUS

## 2018-01-07 MED ORDER — KETOROLAC TROMETHAMINE 30 MG/ML IJ SOLN
30.0000 mg | Freq: Once | INTRAMUSCULAR | Status: AC | PRN
  Administered 2018-01-07: 30 mg via INTRAVENOUS

## 2018-01-07 MED ORDER — KETAMINE HCL 10 MG/ML IJ SOLN
INTRAMUSCULAR | Status: AC
  Filled 2018-01-07: qty 1

## 2018-01-07 MED ORDER — BACITRACIN-NEOMYCIN-POLYMYXIN 400-5-5000 EX OINT
TOPICAL_OINTMENT | CUTANEOUS | Status: AC
  Filled 2018-01-07: qty 1

## 2018-01-07 MED ORDER — PROPOFOL 10 MG/ML IV BOLUS
INTRAVENOUS | Status: AC
  Filled 2018-01-07: qty 20

## 2018-01-07 MED ORDER — FENTANYL CITRATE (PF) 100 MCG/2ML IJ SOLN
INTRAMUSCULAR | Status: AC
  Filled 2018-01-07: qty 2

## 2018-01-07 MED ORDER — LIDOCAINE 2% (20 MG/ML) 5 ML SYRINGE
INTRAMUSCULAR | Status: DC | PRN
  Administered 2018-01-07: 100 mg via INTRAVENOUS

## 2018-01-07 MED ORDER — KETOROLAC TROMETHAMINE 30 MG/ML IJ SOLN
INTRAMUSCULAR | Status: AC
  Filled 2018-01-07: qty 1

## 2018-01-07 MED ORDER — MIDAZOLAM HCL 5 MG/5ML IJ SOLN
INTRAMUSCULAR | Status: DC | PRN
  Administered 2018-01-07: 2 mg via INTRAVENOUS

## 2018-01-07 MED ORDER — KETAMINE HCL 10 MG/ML IJ SOLN
INTRAMUSCULAR | Status: DC | PRN
  Administered 2018-01-07: 30 mg via INTRAVENOUS

## 2018-01-07 MED ORDER — FENTANYL CITRATE (PF) 100 MCG/2ML IJ SOLN
INTRAMUSCULAR | Status: DC | PRN
  Administered 2018-01-07: 50 ug via INTRAVENOUS
  Administered 2018-01-07 (×2): 25 ug via INTRAVENOUS

## 2018-01-07 MED ORDER — DEXAMETHASONE SODIUM PHOSPHATE 10 MG/ML IJ SOLN
INTRAMUSCULAR | Status: AC
  Filled 2018-01-07: qty 1

## 2018-01-07 SURGICAL SUPPLY — 36 items
BENZOIN TINCTURE PRP APPL 2/3 (GAUZE/BANDAGES/DRESSINGS) IMPLANT
BNDG CONFORM 6X.82 1P STRL (GAUZE/BANDAGES/DRESSINGS) ×2 IMPLANT
BNDG GAUZE ELAST 4 BULKY (GAUZE/BANDAGES/DRESSINGS) IMPLANT
COVER SURGICAL LIGHT HANDLE (MISCELLANEOUS) ×2 IMPLANT
COVER WAND RF STERILE (DRAPES) IMPLANT
DERMABOND ADVANCED (GAUZE/BANDAGES/DRESSINGS) ×1
DERMABOND ADVANCED .7 DNX12 (GAUZE/BANDAGES/DRESSINGS) ×1 IMPLANT
DISSECTOR ROUND CHERRY 3/8 STR (MISCELLANEOUS) ×2 IMPLANT
DRAIN PENROSE 18X1/4 LTX STRL (WOUND CARE) ×2 IMPLANT
DRAPE LAPAROTOMY T 98X78 PEDS (DRAPES) ×2 IMPLANT
ELECT REM PT RETURN 15FT ADLT (MISCELLANEOUS) ×2 IMPLANT
GAUZE 4X4 16PLY RFD (DISPOSABLE) IMPLANT
GAUZE SPONGE 4X4 12PLY STRL (GAUZE/BANDAGES/DRESSINGS) ×2 IMPLANT
GLOVE BIOGEL M 8.0 STRL (GLOVE) ×2 IMPLANT
GOWN STRL REUS W/TWL XL LVL3 (GOWN DISPOSABLE) ×2 IMPLANT
KIT BASIN OR (CUSTOM PROCEDURE TRAY) ×2 IMPLANT
NEEDLE HYPO 22GX1.5 SAFETY (NEEDLE) ×2 IMPLANT
NS IRRIG 1000ML POUR BTL (IV SOLUTION) ×2 IMPLANT
PACK GENERAL/GYN (CUSTOM PROCEDURE TRAY) ×2 IMPLANT
STRIP CLOSURE SKIN 1/2X4 (GAUZE/BANDAGES/DRESSINGS) IMPLANT
SUPPORT SCROTAL LG STRP (MISCELLANEOUS) ×2 IMPLANT
SUT CHROMIC 2 0 SH (SUTURE) IMPLANT
SUT CHROMIC 3 0 SH 27 (SUTURE) ×4 IMPLANT
SUT CHROMIC 4 0 SH 27 (SUTURE) ×2 IMPLANT
SUT MNCRL AB 4-0 PS2 18 (SUTURE) IMPLANT
SUT SILK 0 (SUTURE) ×1
SUT SILK 0 30XBRD TIE 6 (SUTURE) ×1 IMPLANT
SUT SILK 2 0 (SUTURE)
SUT SILK 2-0 18XBRD TIE 12 (SUTURE) IMPLANT
SUT SILK 3 0 SH 30 (SUTURE) IMPLANT
SUT VIC AB 2-0 SH 27 (SUTURE)
SUT VIC AB 2-0 SH 27X BRD (SUTURE) IMPLANT
SUT VIC AB 3-0 SH 27 (SUTURE)
SUT VIC AB 3-0 SH 27XBRD (SUTURE) IMPLANT
SYR CONTROL 10ML LL (SYRINGE) ×2 IMPLANT
TOWEL OR 17X26 10 PK STRL BLUE (TOWEL DISPOSABLE) ×2 IMPLANT

## 2018-01-07 NOTE — Discharge Instructions (Signed)
Scrotal surgery postoperative instructions  Wound:  In most cases your incision will have absorbable sutures that will dissolve within the first 10-20 days. Some will fall out even earlier. Expect some redness as the sutures dissolved but this should occur only around the sutures. If there is generalized redness, especially with increasing pain or swelling, let us know. The scrotum will very likely get "black and blue" as the blood in the tissues spread. Sometimes the whole scrotum will turn colors. The black and blue is followed by a yellow and brown color. In time, all the discoloration will go away. In some cases some firm swelling in the area of the testicle may persist for up to 4-6 weeks after the surgery and is considered normal in most cases.  Diet:  You may return to your normal diet within 24 hours following your surgery. You may note some mild nausea and possibly vomiting the first 6-8 hours following surgery. This is usually due to the side effects of anesthesia, and will disappear quite soon. I would suggest clear liquids and a very light meal the first evening following your surgery.  Activity:  Your physical activity should be restricted the first 48 hours. During that time you should remain relatively inactive, moving about only when necessary. During the first 7-10 days following surgery he should avoid lifting any heavy objects (anything greater than 15 pounds), and avoid strenuous exercise. If you work, ask [place] specifically about your restrictions, both for work and home. We will write a note to your employer if needed.  You should plan to wear a tight pair of jockey shorts or an athletic supporter for the first 4-5 days, even to sleep. This will keep the scrotum immobilized to some degree and keep the swelling down.  Ice packs should be placed on and off over the scrotum for the first 48 hours. Frozen peas or corn in a ZipLock bag can be frozen, used and re-frozen. Fifteen minutes  on and 15 minutes off is a reasonable schedule. The ice is a good pain reliever and keeps the swelling down.  Hygiene:  You may shower 48 hours after your surgery. Tub bathing should be restricted until the seventh day.          Medication:  You will be sent home with some type of pain medication. In many cases you will be sent home with a narcotic pain pill (hydrococone or oxycodone). If the pain is not too bad, you may take either Tylenol (acetaminophen) or Advil (ibuprofen) which contain no narcotic agents, and might be tolerated a little better, with fewer side effects. If the pain medication you are sent home with does not control the pain, you will have to let us know. Some narcotic pain medications cannot be given or refilled by a phone call to a pharmacy.  Problems you should report to [place]:   Fever of 101.0 degrees Fahrenheit or greater.  Moderate or severe swelling under the skin incision or involving the scrotum.  Drug reaction such as hives, a rash, nausea or vomiting.   General Anesthesia, Adult, Care After This sheet gives you information about how to care for yourself after your procedure. Your health care provider may also give you more specific instructions. If you have problems or questions, contact your health care provider. What can I expect after the procedure? After the procedure, the following side effects are common:  Pain or discomfort at the IV site.  Nausea.  Vomiting.  Sore throat.  Trouble concentrating.  Feeling cold or chills.  Weak or tired.  Sleepiness and fatigue.  Soreness and body aches. These side effects can affect parts of the body that were not involved in surgery. Follow these instructions at home:  For at least 24 hours after the procedure:  Have a responsible adult stay with you. It is important to have someone help care for you until you are awake and alert.  Rest as needed.  Do not: ? Participate in activities in  which you could fall or become injured. ? Drive. ? Use heavy machinery. ? Drink alcohol. ? Take sleeping pills or medicines that cause drowsiness. ? Make important decisions or sign legal documents. ? Take care of children on your own. Eating and drinking  Follow any instructions from your health care provider about eating or drinking restrictions.  When you feel hungry, start by eating small amounts of foods that are soft and easy to digest (bland), such as toast. Gradually return to your regular diet.  Drink enough fluid to keep your urine pale yellow.  If you vomit, rehydrate by drinking water, juice, or clear broth. General instructions  If you have sleep apnea, surgery and certain medicines can increase your risk for breathing problems. Follow instructions from your health care provider about wearing your sleep device: ? Anytime you are sleeping, including during daytime naps. ? While taking prescription pain medicines, sleeping medicines, or medicines that make you drowsy.  Return to your normal activities as told by your health care provider. Ask your health care provider what activities are safe for you.  Take over-the-counter and prescription medicines only as told by your health care provider.  If you smoke, do not smoke without supervision.  Keep all follow-up visits as told by your health care provider. This is important. Contact a health care provider if:  You have nausea or vomiting that does not get better with medicine.  You cannot eat or drink without vomiting.  You have pain that does not get better with medicine.  You are unable to pass urine.  You develop a skin rash.  You have a fever.  You have redness around your IV site that gets worse. Get help right away if:  You have difficulty breathing.  You have chest pain.  You have blood in your urine or stool, or you vomit blood. Summary  After the procedure, it is common to have a sore throat or  nausea. It is also common to feel tired.  Have a responsible adult stay with you for the first 24 hours after general anesthesia. It is important to have someone help care for you until you are awake and alert.  When you feel hungry, start by eating small amounts of foods that are soft and easy to digest (bland), such as toast. Gradually return to your regular diet.  Drink enough fluid to keep your urine pale yellow.  Return to your normal activities as told by your health care provider. Ask your health care provider what activities are safe for you. This information is not intended to replace advice given to you by your health care provider. Make sure you discuss any questions you have with your health care provider. Document Released: 03/30/2000 Document Revised: 08/07/2016 Document Reviewed: 08/07/2016 Elsevier Interactive Patient Education  2019 [employer]

## 2018-01-07 NOTE — Anesthesia Procedure Notes (Signed)
Procedure Name: LMA Insertion Date/Time: 01/07/2018 7:36 AM Performed by: Florene Route, CRNA Patient Re-evaluated:Patient Re-evaluated prior to induction Oxygen Delivery Method: Circle system utilized Preoxygenation: Pre-oxygenation with 100% oxygen Induction Type: IV induction Ventilation: Mask ventilation without difficulty LMA: LMA inserted LMA Size: 4.0 Number of attempts: 1 Placement Confirmation: positive ETCO2 and breath sounds checked- equal and bilateral Tube secured with: Tape Dental Injury: Teeth and Oropharynx as per pre-operative assessment

## 2018-01-07 NOTE — Transfer of Care (Signed)
Immediate Anesthesia Transfer of Care Note  Patient: Daniel Burch  Procedure(s) Performed: SCROTUM EXPLORATION/ EXCISION OF MASS/POSSIBLE RIGHT EPIDIDYECTOMY/ POSSIBLE ORCHIECTOMY (Right )  Patient Location: PACU  Anesthesia Type:General  Level of Consciousness: awake and alert   Airway & Oxygen Therapy: Patient Spontanous Breathing and Patient connected to face mask oxygen  Post-op Assessment: Report given to RN and Post -op Vital signs reviewed and stable  Post vital signs: Reviewed and stable  Last Vitals:  Vitals Value Taken Time  BP 144/67 01/07/2018  8:49 AM  Temp    Pulse 82 01/07/2018  8:50 AM  Resp 12 01/07/2018  8:50 AM  SpO2 100 % 01/07/2018  8:50 AM  Vitals shown include unvalidated device data.  Last Pain:  Vitals:   01/07/18 0636  TempSrc:   PainSc: 0-No pain      Patients Stated Pain Goal: 3 (01/07/18 0636)  Complications: No apparent anesthesia complications

## 2018-01-07 NOTE — Anesthesia Postprocedure Evaluation (Signed)
Anesthesia Post Note  Patient: Vanson Soldan  Procedure(s) Performed: SCROTUM EXPLORATION/ EXCISION OF MASS/POSSIBLE RIGHT EPIDIDYECTOMY/ POSSIBLE ORCHIECTOMY (Right )     Patient location during evaluation: PACU Anesthesia Type: General Level of consciousness: awake and alert Pain management: pain level controlled Vital Signs Assessment: post-procedure vital signs reviewed and stable Respiratory status: spontaneous breathing, nonlabored ventilation, respiratory function stable and patient connected to nasal cannula oxygen Cardiovascular status: blood pressure returned to baseline and stable Postop Assessment: no apparent nausea or vomiting Anesthetic complications: no    Last Vitals:  Vitals:   01/07/18 0925 01/07/18 0945  BP: (!) 142/88 135/74  Pulse: 80 76  Resp: 16 17  Temp: 37 C 37 C  SpO2: 97% 99%    Last Pain:  Vitals:   01/07/18 0945  TempSrc:   PainSc: 0-No pain                 Kennieth Rad

## 2018-01-07 NOTE — Op Note (Signed)
PATIENT:  Daniel Burch  PRE-OPERATIVE DIAGNOSIS: Right scrotal mass  POST-OPERATIVE DIAGNOSIS: Right scrotal fat necrosis  PROCEDURE: 1.  Scrotal exploration. 2.  Excision of right scrotal mass  SURGEON:  G[place] Farm  INDICATION: Daniel Burch is a 33 year old male who noticed a testicular mass about 5 months ago that had not grown in size.  An ultrasound revealed normal testicles bilaterally with normal right epididymis.  Examination revealed an irregular somewhat serpiginous mass in the right hemiscrotum that seem to be separate from the testicle and somewhat anterior to it.  It was initially observed and on follow-up the patient reported it may have increased in size slightly.  We therefore discussed exploration and excision.  ANESTHESIA:  General  EBL:  Minimal  DRAINS: None  LOCAL MEDICATIONS USED:  1/4 percent Marcaine with epinephrine  SPECIMEN: Right hemiscrotal mass to pathology  Description of procedure: After informed consent the patient was taken to the operating room and placed on the table in a supine position. General anesthesia was then administered. Once fully anesthetized the genitalia were sterilely prepped and draped in standard fashion. An official timeout was then performed.  A midline median raphae scrotal incision was made and carried down to the mass which was easily palpable.  I also noted the testicle felt palpable but first opened the parietal tunica albuginea and delivered the testicle and epididymis.  Visual inspection revealed a normal testicle and normal epididymis.  The lesion was clearly separate from the structures.  It was yellow and firm.  It was irregular.  It seemed to be associated with fatty tissue within the scrotum.  I used sharp technique to excise all of this lesion and in doing so I noted it was within adipose tissue.  This was sent for frozen section and returned showing fat necrosis with no evidence of malignancy.  The right testicle  was then replaced in the tunica vaginalis and I closed this with a running 4-0 chromic in a locking fashion.  All bleeding points within the scrotum were cauterized and the testicle was replaced in its normal anatomic position in the right hemiscrotum.  I injected Marcaine in the subcutaneous tissue and then closed the deep scrotal tissue with a running, locking 3-0 chromic suture and then closed the skin with running 3-0 chromic.  Neosporin was applied to the incision and a sterile gauze dressing was placed over this followed by a fluffed Curlex and a scrotal support.  The patient was then awakened and taken to the recovery room in stable and satisfactory condition.  He tolerated the procedure well with no intraoperative complications with needle, sponge and instrument counts correct x2 at the end of the operation.    PLAN OF CARE: Discharge to home after PACU  PATIENT DISPOSITION:  PACU - hemodynamically stable.

## 2018-01-08 ENCOUNTER — Encounter (HOSPITAL_COMMUNITY): Payer: Self-pay | Admitting: Urology

## 2018-12-17 IMAGING — US US ABDOMEN COMPLETE
1 series · 13 of 25 positions shown · non-contrast
Comparison: None in PACs

CLINICAL DATA: Hepatitis-C, fatty liver disease.

EXAM:
ABDOMEN ULTRASOUND COMPLETE

[Series 1: us abdomen complete · 0.18mm/px · 13 of 96 slices shown]
[im 1/96]
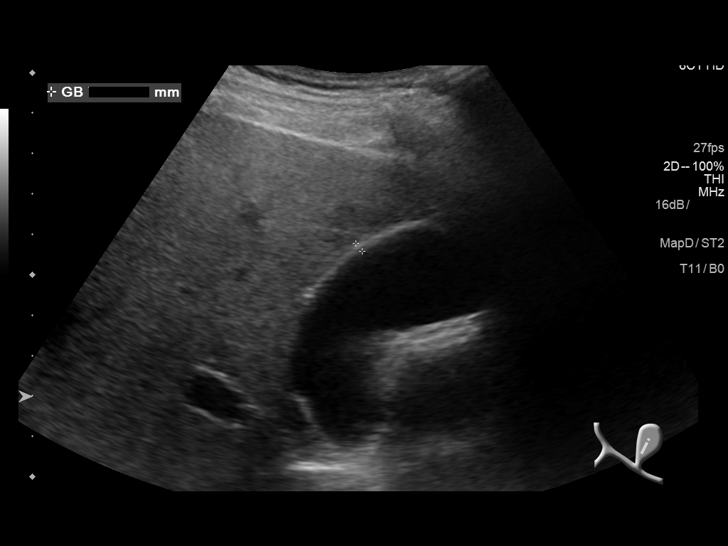
[im 8/96]
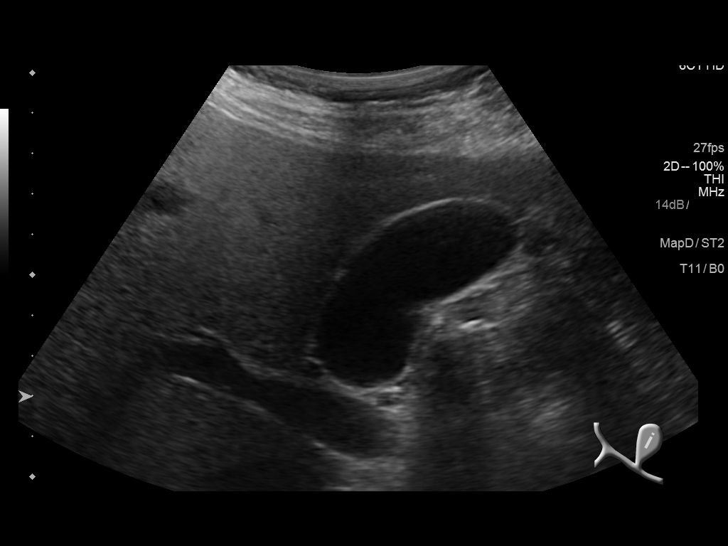
[im 16/96]
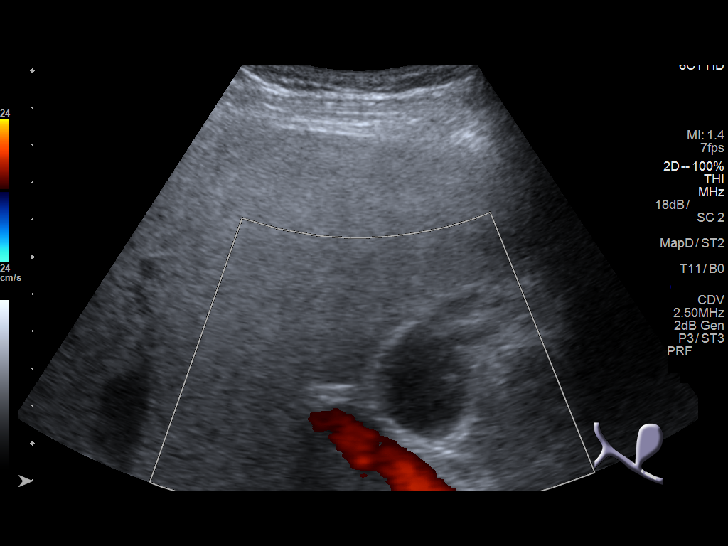
[im 24/96]
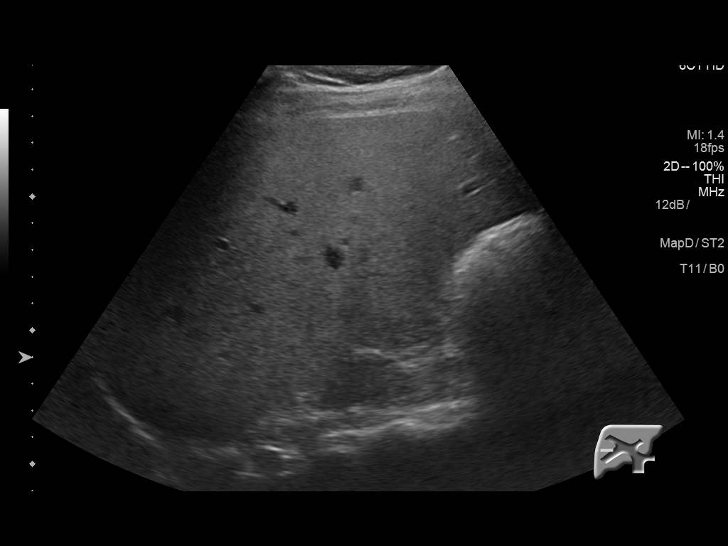
[im 32/96]
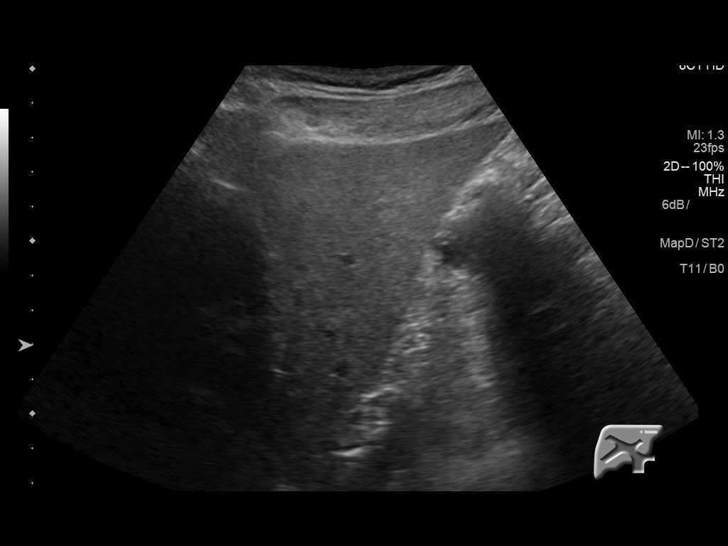
[im 40/96]
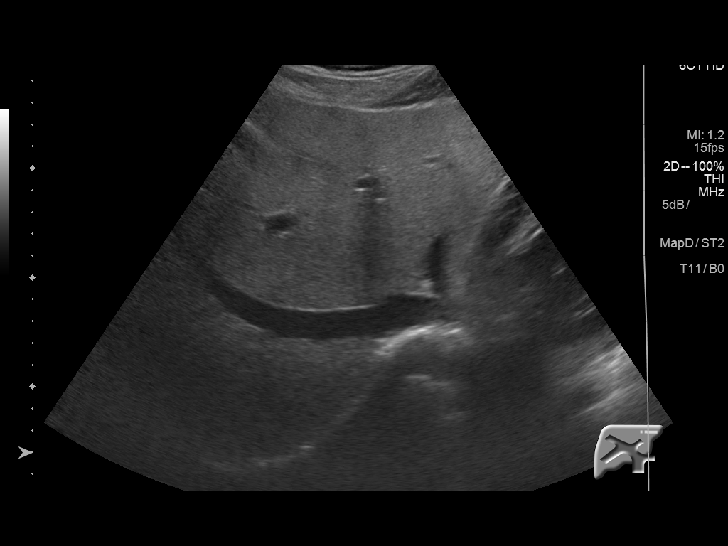
[im 48/96]
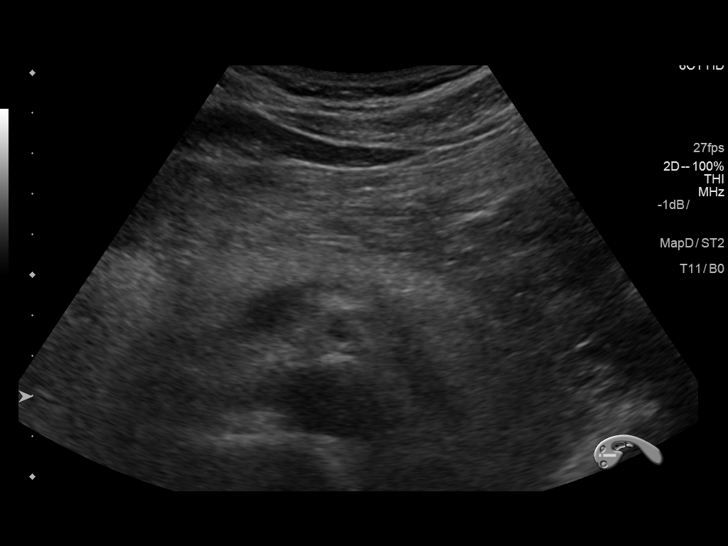
[im 56/96]
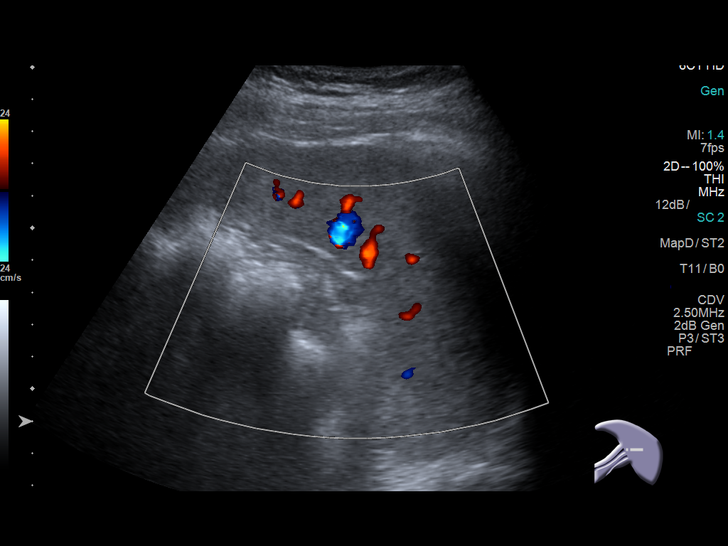
[im 64/96]
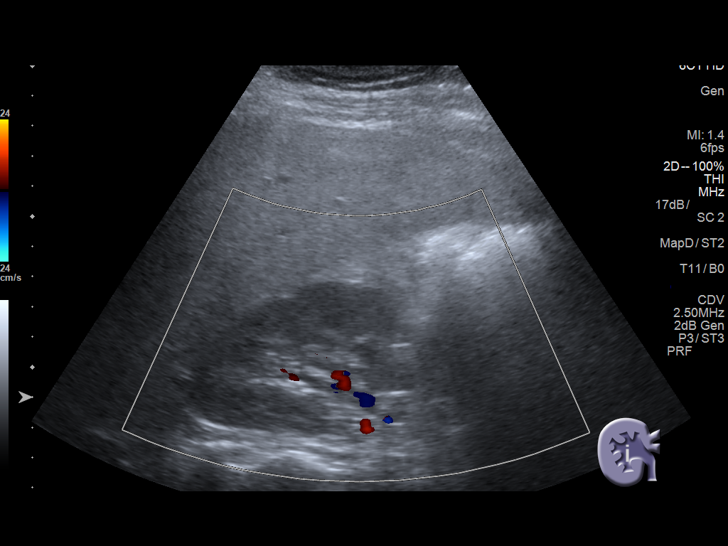
[im 72/96]
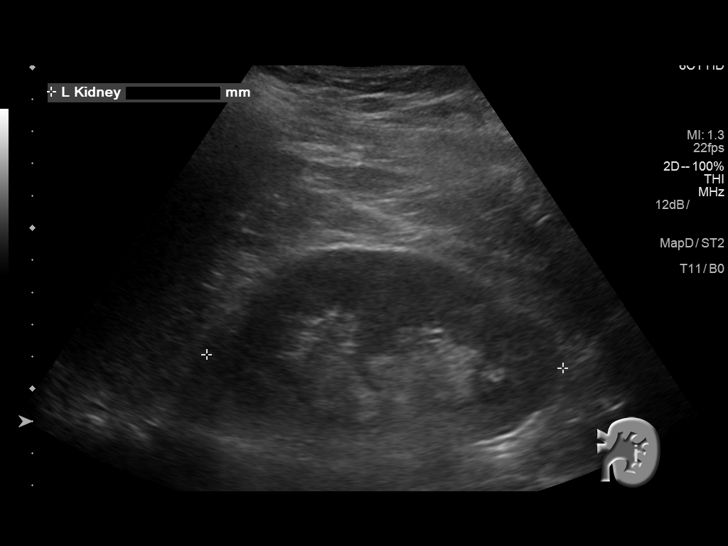
[im 80/96]
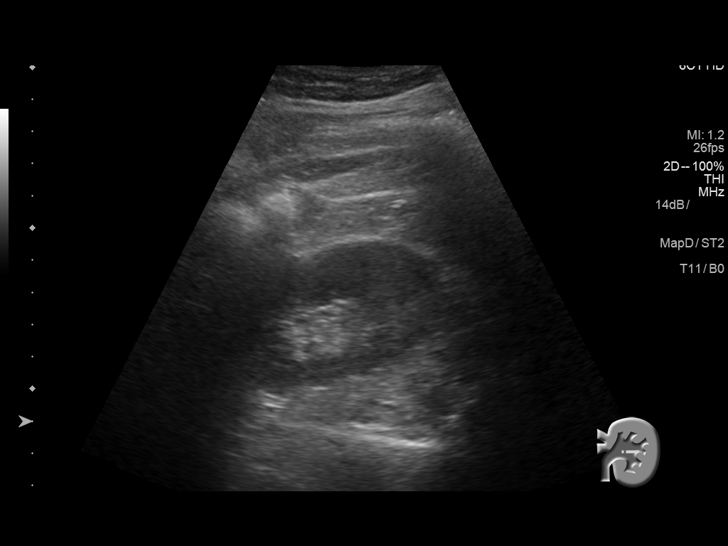
[im 88/96]
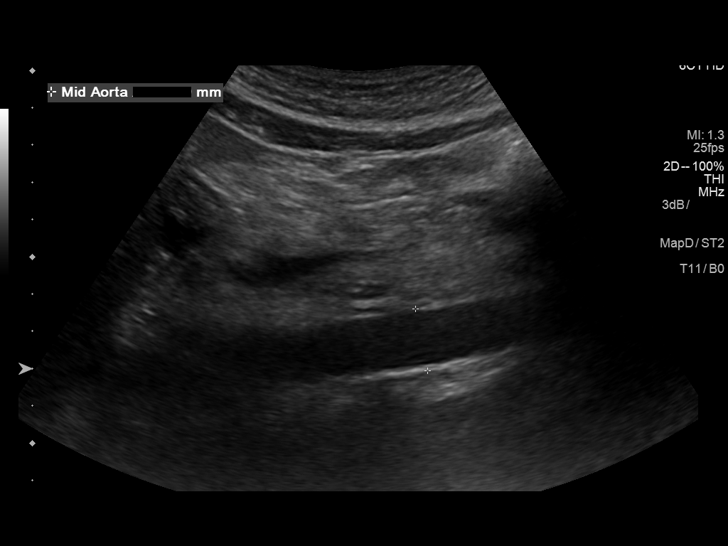
[im 96/96]
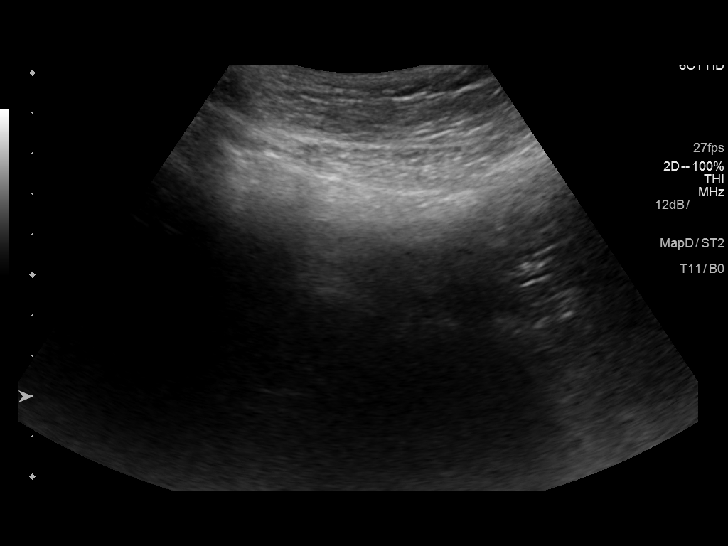

[13 of 25 positions shown; findings below may reference images not displayed]

FINDINGS: Gallbladder: No gallstones or wall thickening visualized. No
sonographic Murphy sign noted by sonographer.

Common bile duct: Diameter: 4.2 mm

Liver: Increased hepatic echotexture. No focal mass or ductal
dilation. The surface contour of the liver is smooth. Portal vein is
patent on color Doppler imaging with normal direction of blood flow
towards the liver.

IVC: No abnormality visualized.

Pancreas: Visualized portion unremarkable.

Spleen: Size and appearance within normal limits.

Right Kidney: Length: 10.7 cm. Echogenicity within normal limits. No
mass or hydronephrosis visualized.

Left Kidney: Length: 11.1 cm. Echogenicity within normal limits. No
mass or hydronephrosis visualized.

Abdominal aorta: No aneurysm visualized. Bowel gas obscures the
bifurcation.

Other findings: None.
IMPRESSION: Increased hepatic echotexture most compatible with fatty
infiltrative change but certainly hepatocellular disease could
produce similar findings. There is no focal mass, ductal dilation,
or surface contour nodularity.

No acute abnormality is observed elsewhere within the abdomen.

## 2019-06-17 ENCOUNTER — Encounter (HOSPITAL_COMMUNITY): Payer: Self-pay

## 2019-06-17 ENCOUNTER — Emergency Department (HOSPITAL_COMMUNITY)
Admission: EM | Admit: 2019-06-17 | Discharge: 2019-06-17 | Disposition: A | Discharge status: Home / Self Care | Payer: BC Managed Care – PPO | Attending: Emergency Medicine | Admitting: Emergency Medicine

## 2019-06-17 ENCOUNTER — Other Ambulatory Visit: Payer: Self-pay

## 2019-06-17 DIAGNOSIS — E039 Hypothyroidism, unspecified: Secondary | ICD-10-CM | POA: Diagnosis not present

## 2019-06-17 DIAGNOSIS — F419 Anxiety disorder, unspecified: Secondary | ICD-10-CM | POA: Diagnosis present

## 2019-06-17 DIAGNOSIS — I10 Essential (primary) hypertension: Secondary | ICD-10-CM | POA: Insufficient documentation

## 2019-06-17 DIAGNOSIS — Z760 Encounter for issue of repeat prescription: Secondary | ICD-10-CM | POA: Diagnosis not present

## 2019-06-17 DIAGNOSIS — Z79899 Other long term (current) drug therapy: Secondary | ICD-10-CM | POA: Diagnosis not present

## 2019-06-17 MED ORDER — ALPRAZOLAM 0.25 MG PO TABS
1.0000 mg | ORAL_TABLET | Freq: Once | ORAL | Status: AC
  Administered 2019-06-17: 1 mg via ORAL
  Filled 2019-06-17: qty 4
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
## 2021-07-29 ENCOUNTER — Encounter (INDEPENDENT_AMBULATORY_CARE_PROVIDER_SITE_OTHER): Payer: Self-pay | Admitting: *Deleted

## 2021-08-06 ENCOUNTER — Encounter (INDEPENDENT_AMBULATORY_CARE_PROVIDER_SITE_OTHER): Payer: Self-pay | Admitting: *Deleted

## 2021-08-19 DIAGNOSIS — R69 Illness, unspecified: Secondary | ICD-10-CM | POA: Diagnosis not present

## 2021-08-23 ENCOUNTER — Ambulatory Visit
Admission: EM | Admit: 2021-08-23 | Discharge: 2021-08-23 | Disposition: A | Discharge status: Home / Self Care | Payer: 59 | Attending: Nurse Practitioner | Admitting: Nurse Practitioner

## 2021-08-23 DIAGNOSIS — F411 Generalized anxiety disorder: Secondary | ICD-10-CM

## 2021-08-23 DIAGNOSIS — Z760 Encounter for issue of repeat prescription: Secondary | ICD-10-CM | POA: Diagnosis not present

## 2021-08-23 DIAGNOSIS — R69 Illness, unspecified: Secondary | ICD-10-CM | POA: Diagnosis not present

## 2021-08-23 MED ORDER — ALPRAZOLAM 1 MG PO TABS: 1.0000 mg | ORAL_TABLET | Freq: Every day | ORAL | 0 refills | Status: AC

## 2021-08-23 NOTE — ED Triage Notes (Signed)
Pt presents for alprazolam 1 mg refill. Pt reports he had refill eft but his PCP moved to [place].

## 2021-08-23 NOTE — Discharge Instructions (Addendum)
Take medication as prescribed. Can go to [email or web address] to try to find a psychiatrist in the surrounding area. Follow up with Day Loraine Leriche that you are being referred to for future medication maintenance. Follow-up with PCP for further evaluation.
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
## 2021-09-02 DIAGNOSIS — R69 Illness, unspecified: Secondary | ICD-10-CM | POA: Diagnosis not present

## 2021-10-21 ENCOUNTER — Encounter (INDEPENDENT_AMBULATORY_CARE_PROVIDER_SITE_OTHER): Payer: Self-pay

## 2021-10-21 ENCOUNTER — Other Ambulatory Visit (INDEPENDENT_AMBULATORY_CARE_PROVIDER_SITE_OTHER): Payer: Self-pay

## 2021-10-21 ENCOUNTER — Telehealth (INDEPENDENT_AMBULATORY_CARE_PROVIDER_SITE_OTHER): Payer: Self-pay

## 2021-10-21 ENCOUNTER — Ambulatory Visit (INDEPENDENT_AMBULATORY_CARE_PROVIDER_SITE_OTHER): Payer: 59 | Admitting: Gastroenterology

## 2021-10-21 ENCOUNTER — Encounter (INDEPENDENT_AMBULATORY_CARE_PROVIDER_SITE_OTHER): Payer: Self-pay | Admitting: Gastroenterology

## 2021-10-21 VITALS — BP 123/82 | HR 79 | Temp 98.1°F | Ht 71.0 in | Wt 213.6 lb

## 2021-10-21 DIAGNOSIS — K5903 Drug induced constipation: Secondary | ICD-10-CM

## 2021-10-21 DIAGNOSIS — G8929 Other chronic pain: Secondary | ICD-10-CM | POA: Insufficient documentation

## 2021-10-21 DIAGNOSIS — R1010 Upper abdominal pain, unspecified: Secondary | ICD-10-CM | POA: Diagnosis not present

## 2021-10-21 DIAGNOSIS — K625 Hemorrhage of anus and rectum: Secondary | ICD-10-CM

## 2021-10-21 DIAGNOSIS — R110 Nausea: Secondary | ICD-10-CM

## 2021-10-21 DIAGNOSIS — Z8619 Personal history of other infectious and parasitic diseases: Secondary | ICD-10-CM | POA: Insufficient documentation

## 2021-10-21 MED ORDER — LUBIPROSTONE 8 MCG PO CAPS: 8.0000 ug | ORAL_CAPSULE | Freq: Two times a day (BID) | ORAL | 2 refills | Status: DC

## 2021-10-21 MED ORDER — PEG 3350-KCL-NA BICARB-NACL 420 G PO SOLR: 4000.0000 mL | ORAL | 0 refills | Status: DC

## 2021-10-21 NOTE — Telephone Encounter (Signed)
Jabreel Chimento Ann Daquane Aguilar, CMA  ?
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
## 2021-10-21 NOTE — Patient Instructions (Signed)
It was nice to meet you! We will order an Ultrasound to look at your liver We will proceed with EGD and Colonoscopy due to your upper abdominal pain and rectal bleeding Continue with good water intake, around 64 oz per day and diet high in fiber I have sent amitiza 5mcg to your pharmacy, you will take this twice a day. Until we get this approved by your insurance, you can do miralax 1 capful every 8 hours   Follow up 3 months

## 2021-10-28 ENCOUNTER — Telehealth (INDEPENDENT_AMBULATORY_CARE_PROVIDER_SITE_OTHER): Payer: Self-pay | Admitting: *Deleted

## 2021-10-28 NOTE — Telephone Encounter (Signed)
PA done for lubiprostone 8 mcg through cover my meds. PA was approved and I called the drug store to notify pharmacy. Tried to call patient and no answer.

## 2021-10-29 NOTE — Telephone Encounter (Signed)
Patient aware med is approved.

## 2021-10-31 ENCOUNTER — Other Ambulatory Visit: Payer: Self-pay

## 2021-10-31 ENCOUNTER — Emergency Department (HOSPITAL_COMMUNITY): Payer: 59

## 2021-10-31 ENCOUNTER — Emergency Department (HOSPITAL_COMMUNITY)
Admission: EM | Admit: 2021-10-31 | Discharge: 2021-10-31 | Disposition: A | Discharge status: Home / Self Care | Payer: 59 | Attending: Emergency Medicine | Admitting: Emergency Medicine

## 2021-10-31 ENCOUNTER — Emergency Department (HOSPITAL_COMMUNITY)
Admission: RE | Admit: 2021-10-31 | Discharge: 2021-10-31 | Disposition: A | Discharge status: Home / Self Care | Payer: 59 | Source: Ambulatory Visit | Attending: Gastroenterology | Admitting: Gastroenterology

## 2021-10-31 DIAGNOSIS — K5900 Constipation, unspecified: Secondary | ICD-10-CM | POA: Insufficient documentation

## 2021-10-31 DIAGNOSIS — Z79899 Other long term (current) drug therapy: Secondary | ICD-10-CM | POA: Diagnosis not present

## 2021-10-31 DIAGNOSIS — K5909 Other constipation: Secondary | ICD-10-CM | POA: Diagnosis not present

## 2021-10-31 DIAGNOSIS — R109 Unspecified abdominal pain: Secondary | ICD-10-CM | POA: Diagnosis not present

## 2021-10-31 DIAGNOSIS — Z8619 Personal history of other infectious and parasitic diseases: Secondary | ICD-10-CM | POA: Insufficient documentation

## 2021-10-31 DIAGNOSIS — I10 Essential (primary) hypertension: Secondary | ICD-10-CM | POA: Insufficient documentation

## 2021-10-31 DIAGNOSIS — E039 Hypothyroidism, unspecified: Secondary | ICD-10-CM | POA: Insufficient documentation

## 2021-10-31 NOTE — ED Triage Notes (Signed)
Pt reports he has not had a bowel movement for 3 days.  Abdominal pain.  Feels better for a while when he is eating but then returns.  Has tried taking enema and dulcolax suppositories, and linzess with no relief.  No vomiting but is nauseated.  No fever/chills, shob, or cp
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
## 2021-11-25 DIAGNOSIS — Z20822 Contact with and (suspected) exposure to covid-19: Secondary | ICD-10-CM | POA: Diagnosis not present

## 2021-11-25 DIAGNOSIS — R059 Cough, unspecified: Secondary | ICD-10-CM | POA: Diagnosis not present

## 2021-11-25 DIAGNOSIS — J069 Acute upper respiratory infection, unspecified: Secondary | ICD-10-CM | POA: Diagnosis not present

## 2021-12-02 DIAGNOSIS — Z6830 Body mass index (BMI) 30.0-30.9, adult: Secondary | ICD-10-CM | POA: Diagnosis not present

## 2021-12-02 DIAGNOSIS — J302 Other seasonal allergic rhinitis: Secondary | ICD-10-CM | POA: Diagnosis not present

## 2021-12-08 ENCOUNTER — Telehealth (INDEPENDENT_AMBULATORY_CARE_PROVIDER_SITE_OTHER): Payer: Self-pay | Admitting: *Deleted

## 2021-12-08 NOTE — Telephone Encounter (Signed)
Case discussed with Dr. Alva Garnet, he discussed the case with the patient and he is agreeable to proceed with the colonoscopy as scheduled.

## 2021-12-08 NOTE — Telephone Encounter (Signed)
Received VM requesting a call to discuss the anesthesia for his procedure on 12/12/21 with Dr. Levon Hedger. He does not want propofol. Reports he is scared to have propofol for procedure and would like to discuss his options further. I discuss ask Shawna Orleans in Endo and she said the only option is propofol now. Sending to [facility]

## 2021-12-08 NOTE — Telephone Encounter (Signed)
Spoke with the patient, who requested to be "completely out and not feel anything" but stated he did not want propofol as "there was another medication I received in the past  that made me feel less awake and it was better". We discussed the medications used on his most recent procedure in 2020 and the patient reported "it was fentanyl the medication they used, I want to have that medicine". I informed to the patient that as he uses xanax and buprenorphine for a history of drug abuse, he may need very high doses of medications for conscious sedation and he may still not be completely sedated, which is not ideal as he will have an EGD/colonoscopy.  The patient understood but repeatedly reinforced the need to receive fentanyl. He asked me to discuss with Dr. Alva Garnet and he would like to get a call from him to discuss this again. I discussed with Dr. Alva Garnet who is aware of the case.

## 2021-12-11 ENCOUNTER — Telehealth: Payer: Self-pay | Admitting: *Deleted

## 2021-12-11 NOTE — Telephone Encounter (Signed)
Thanks for the update

## 2021-12-11 NOTE — Telephone Encounter (Signed)
Pt called in. Wanted to discuss clear liquid of what he can/can't have. He stated he didn't have his instructions but reports he did start a clear liquid diet for the entire day yesterday and did the dulcolax and miralax. I advised if he didn't to let me know because if he is not cleaned out properly then procedure most likely won't be done. He stated he thought he would be fine. I did sent the instructions to his mychart. FYI to Dr. Levon Hedger in case patient is not cleaned out.

## 2021-12-12 ENCOUNTER — Encounter (HOSPITAL_COMMUNITY): Payer: Self-pay | Admitting: Gastroenterology

## 2021-12-12 ENCOUNTER — Ambulatory Visit (HOSPITAL_COMMUNITY)
Admission: RE | Admit: 2021-12-12 | Discharge: 2021-12-12 | Disposition: A | Discharge status: Home / Self Care | Payer: 59 | Source: Ambulatory Visit | Attending: Gastroenterology | Admitting: Gastroenterology

## 2021-12-12 ENCOUNTER — Encounter (HOSPITAL_COMMUNITY)
Admission: RE | Disposition: A | Discharge status: Home / Self Care | Payer: Self-pay | Source: Ambulatory Visit | Attending: Gastroenterology

## 2021-12-12 ENCOUNTER — Ambulatory Visit (HOSPITAL_BASED_OUTPATIENT_CLINIC_OR_DEPARTMENT_OTHER): Payer: 59 | Admitting: Anesthesiology

## 2021-12-12 ENCOUNTER — Other Ambulatory Visit: Payer: Self-pay

## 2021-12-12 ENCOUNTER — Ambulatory Visit (HOSPITAL_COMMUNITY): Payer: 59 | Admitting: Anesthesiology

## 2021-12-12 DIAGNOSIS — I10 Essential (primary) hypertension: Secondary | ICD-10-CM | POA: Insufficient documentation

## 2021-12-12 DIAGNOSIS — R1013 Epigastric pain: Secondary | ICD-10-CM

## 2021-12-12 DIAGNOSIS — M549 Dorsalgia, unspecified: Secondary | ICD-10-CM | POA: Diagnosis not present

## 2021-12-12 DIAGNOSIS — Z79899 Other long term (current) drug therapy: Secondary | ICD-10-CM | POA: Insufficient documentation

## 2021-12-12 DIAGNOSIS — K625 Hemorrhage of anus and rectum: Secondary | ICD-10-CM | POA: Diagnosis not present

## 2021-12-12 DIAGNOSIS — R110 Nausea: Secondary | ICD-10-CM | POA: Diagnosis not present

## 2021-12-12 DIAGNOSIS — K644 Residual hemorrhoidal skin tags: Secondary | ICD-10-CM

## 2021-12-12 DIAGNOSIS — F419 Anxiety disorder, unspecified: Secondary | ICD-10-CM

## 2021-12-12 DIAGNOSIS — E039 Hypothyroidism, unspecified: Secondary | ICD-10-CM | POA: Insufficient documentation

## 2021-12-12 DIAGNOSIS — R69 Illness, unspecified: Secondary | ICD-10-CM | POA: Diagnosis not present

## 2021-12-12 DIAGNOSIS — K2950 Unspecified chronic gastritis without bleeding: Secondary | ICD-10-CM | POA: Diagnosis not present

## 2021-12-12 DIAGNOSIS — G8929 Other chronic pain: Secondary | ICD-10-CM | POA: Diagnosis not present

## 2021-12-12 DIAGNOSIS — Z8619 Personal history of other infectious and parasitic diseases: Secondary | ICD-10-CM | POA: Insufficient documentation

## 2021-12-12 HISTORY — PX: BIOPSY: SHX5522

## 2021-12-12 HISTORY — PX: COLONOSCOPY WITH PROPOFOL: SHX5780

## 2021-12-12 HISTORY — PX: ESOPHAGOGASTRODUODENOSCOPY (EGD) WITH PROPOFOL: SHX5813

## 2021-12-12 LAB — HM COLONOSCOPY

## 2021-12-12 SURGERY — COLONOSCOPY WITH PROPOFOL
Anesthesia: General

## 2021-12-12 MED ORDER — PHENYLEPHRINE 80 MCG/ML (10ML) SYRINGE FOR IV PUSH (FOR BLOOD PRESSURE SUPPORT)
PREFILLED_SYRINGE | INTRAVENOUS | Status: DC | PRN
  Administered 2021-12-12: 80 ug via INTRAVENOUS

## 2021-12-12 MED ORDER — LIDOCAINE HCL (CARDIAC) PF 100 MG/5ML IV SOSY
PREFILLED_SYRINGE | INTRAVENOUS | Status: DC | PRN
  Administered 2021-12-12: 50 mg via INTRATRACHEAL

## 2021-12-12 MED ORDER — STERILE WATER FOR IRRIGATION IR SOLN
Status: DC | PRN
  Administered 2021-12-12: 240 mL

## 2021-12-12 MED ORDER — FENTANYL CITRATE (PF) 100 MCG/2ML IJ SOLN
INTRAMUSCULAR | Status: AC
  Filled 2021-12-12: qty 2

## 2021-12-12 MED ORDER — LACTATED RINGERS IV SOLN: INTRAVENOUS | Status: DC

## 2021-12-12 MED ORDER — FENTANYL CITRATE (PF) 100 MCG/2ML IJ SOLN
50.0000 ug | Freq: Once | INTRAMUSCULAR | Status: AC
  Administered 2021-12-12: 50 ug via INTRAVENOUS

## 2021-12-12 MED ORDER — PROPOFOL 500 MG/50ML IV EMUL
INTRAVENOUS | Status: DC | PRN
  Administered 2021-12-12: 300 ug/kg/min via INTRAVENOUS

## 2021-12-12 MED ORDER — PROPOFOL 10 MG/ML IV BOLUS
INTRAVENOUS | Status: DC | PRN
  Administered 2021-12-12: 50 mg via INTRAVENOUS
  Administered 2021-12-12: 100 mg via INTRAVENOUS

## 2021-12-12 NOTE — Op Note (Signed)
[facility] Patient Name: Daniel Burch Procedure Date: 12/12/2021 8:34 AM MRN: [id number] Date of Birth: 1985/03/17 Attending MD: Katrinka Blazing , , [id number] CSN: [id number] Age: 36 Admit Type: Outpatient Procedure:                Upper GI endoscopy Indications:              Epigastric abdominal pain, Nausea Providers:                Katrinka Blazing, Sheran Fava, Burke Keels, Technician Referring MD:              Medicines:                Monitored Anesthesia Care Complications:            No immediate complications. Estimated Blood Loss:     Estimated blood loss: none. Procedure:                Pre-Anesthesia Assessment:                           - Prior to the procedure, a History and Physical                            was performed, and patient medications, allergies                            and sensitivities were reviewed. The patient's                            tolerance of previous anesthesia was reviewed.                           - The risks and benefits of the procedure and the                            sedation options and risks were discussed with the                            patient. All questions were answered and informed                            consent was obtained.                           - ASA Grade Assessment: II - A patient with mild                            systemic disease.                           After obtaining informed consent, the endoscope was                            passed under direct vision. Throughout the  procedure, the patient's blood pressure, pulse, and                            oxygen saturations were monitored continuously. The                            [id number]-[id number] ([id number]) scope was introduced through the                            mouth, and advanced to the second part of duodenum.                            The upper GI endoscopy was accomplished without                             difficulty. The patient tolerated the procedure                            well. Scope In: 8:46:36 AM Scope Out: 8:53:47 AM Total Procedure Duration: 0 hours 7 minutes 11 seconds  Findings:      The Z-line was regular and was found 41 cm from the incisors.      The esophagus was normal.      The entire examined stomach was normal. Biopsies were taken with a cold       forceps for Helicobacter pylori testing.      The examined duodenum was normal. Biopsies were taken with a cold       forceps for histology. Impression:               - Z-line regular, 41 cm from the incisors.                           - Normal esophagus.                           - Normal stomach. Biopsied.                           - Normal examined duodenum. Biopsied. Moderate Sedation:      Per Anesthesia Care Recommendation:           - Discharge patient to home (ambulatory).                           - Resume previous diet.                           - Await pathology results. Procedure Code(s):        --- Professional ---                           [phone], Esophagogastroduodenoscopy, flexible,                            transoral; with biopsy, single or multiple Diagnosis Code(s):        --- Professional ---  R10.13, Epigastric pain                           R11.0, Nausea CPT copyright 2022 American Medical Association. All rights reserved. The codes documented in this report are preliminary and upon coder review may  be revised to meet current compliance requirements. Katrinka Blazing, MD Katrinka Blazing,  12/12/2021 9:17:26 AM This report has been signed electronically. Number of Addenda: 0
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
## 2021-12-12 NOTE — Transfer of Care (Signed)
Immediate Anesthesia Transfer of Care Note  Patient: Daniel Burch  Procedure(s) Performed: COLONOSCOPY WITH PROPOFOL ESOPHAGOGASTRODUODENOSCOPY (EGD) WITH PROPOFOL BIOPSY  Patient Location: Endoscopy Unit  Anesthesia Type:General  Level of Consciousness: sedated, patient cooperative, and responds to stimulation  Airway & Oxygen Therapy: Patient Spontanous Breathing  Post-op Assessment: Report given to RN, Post -op Vital signs reviewed and stable, and Patient moving all extremities  Post vital signs: Reviewed and stable  Last Vitals:  Vitals Value Taken Time  BP 89/35 12/12/21 0920  Temp 36.7 C 12/12/21 0920  Pulse 72 12/12/21 0920  Resp 19 12/12/21 0920  SpO2 95 % 12/12/21 0920    Last Pain:  Vitals:   12/12/21 0920  TempSrc: Oral  PainSc:       Patients Stated Pain Goal: 8 (12/12/21 0724)  Complications: No notable events documented.

## 2021-12-12 NOTE — Discharge Instructions (Addendum)
You are being discharged to home.  Resume your previous diet.  We are waiting for your pathology results.  Start Amitiza 8 mcg twice a day as previously advised. If not improving with Amitiza, can concomittantly start taking one capful of Miralax every day, uptitrate up to 3 capfuls per day as needed

## 2021-12-12 NOTE — Anesthesia Postprocedure Evaluation (Signed)
Anesthesia Post Note  Patient: Ida Uppal  Procedure(s) Performed: COLONOSCOPY WITH PROPOFOL ESOPHAGOGASTRODUODENOSCOPY (EGD) WITH PROPOFOL BIOPSY  Patient location during evaluation: Phase II Anesthesia Type: General Level of consciousness: awake and alert and oriented Pain management: pain level controlled Vital Signs Assessment: post-procedure vital signs reviewed and stable Respiratory status: spontaneous breathing, nonlabored ventilation and respiratory function stable Cardiovascular status: blood pressure returned to baseline and stable Postop Assessment: no apparent nausea or vomiting Anesthetic complications: no  No notable events documented.   Last Vitals:  Vitals:   12/12/21 0920 12/12/21 0925  BP: (!) 89/35 (!) 93/49  Pulse: 72 75  Resp: 19 17  Temp: 36.7 C   SpO2: 95% 97%    Last Pain:  Vitals:   12/12/21 0925  TempSrc:   PainSc: 0-No pain                 Elizibeth Breau C Epsie Walthall

## 2021-12-12 NOTE — Anesthesia Preprocedure Evaluation (Addendum)
Anesthesia Evaluation  Patient identified by MRN, date of birth, ID band Patient awake    Reviewed: Allergy & Precautions, H&P , NPO status , Patient's Chart, lab work & pertinent test results  Airway Mallampati: II  TM Distance: >3 FB Neck ROM: Full    Dental  (+) Dental Advisory Given, Teeth Intact   Pulmonary neg pulmonary ROS   Pulmonary exam normal breath sounds clear to auscultation       Cardiovascular Exercise Tolerance: Good hypertension, Pt. on medications Normal cardiovascular exam Rhythm:Regular Rate:Normal     Neuro/Psych  PSYCHIATRIC DISORDERS Anxiety     negative neurological ROS     GI/Hepatic negative GI ROS,,,(+) Hepatitis -, C  Endo/Other  Hypothyroidism    Renal/GU negative Renal ROS  negative genitourinary   Musculoskeletal negative musculoskeletal ROS (+)    Abdominal   Peds negative pediatric ROS (+)  Hematology negative hematology ROS (+)   Anesthesia Other Findings Chronic back pain, agreed to give small to dose of fentanyl.  Reproductive/Obstetrics negative OB ROS                             Anesthesia Physical Anesthesia Plan  ASA: 3  Anesthesia Plan: General   Post-op Pain Management: Minimal or no pain anticipated   Induction: Intravenous  PONV Risk Score and Plan: 1 and Propofol infusion  Airway Management Planned: Nasal Cannula and Natural Airway  Additional Equipment:   Intra-op Plan:   Post-operative Plan:   Informed Consent: I have reviewed the patients History and Physical, chart, labs and discussed the procedure including the risks, benefits and alternatives for the proposed anesthesia with the patient or authorized representative who has indicated his/her understanding and acceptance.     Dental advisory given  Plan Discussed with: CRNA and Surgeon  Anesthesia Plan Comments:        Anesthesia Quick Evaluation

## 2021-12-12 NOTE — Op Note (Signed)
[facility] Patient Name: Daniel Burch Procedure Date: 12/12/2021 8:56 AM MRN: [id number] Date of Birth: Jul 17, 1985 Attending MD: Katrinka Blazing , , [id number] CSN: [id number] Age: 36 Admit Type: Outpatient Procedure:                Colonoscopy Indications:              Rectal bleeding Providers:                Katrinka Blazing, Sheran Fava, Burke Keels, Technician Referring MD:              Medicines:                Monitored Anesthesia Care Complications:            No immediate complications. Estimated Blood Loss:     Estimated blood loss: none. Procedure:                Pre-Anesthesia Assessment:                           - Prior to the procedure, a History and Physical                            was performed, and patient medications, allergies                            and sensitivities were reviewed. The patient's                            tolerance of previous anesthesia was reviewed.                           - The risks and benefits of the procedure and the                            sedation options and risks were discussed with the                            patient. All questions were answered and informed                            consent was obtained.                           - ASA Grade Assessment: II - A patient with mild                            systemic disease.                           After obtaining informed consent, the colonoscope                            was passed under direct vision. Throughout the  procedure, the patient's blood pressure, pulse, and                            oxygen saturations were monitored continuously. The                            [id number]-HQ190L ([id number]) scope was introduced through                            the anus and advanced to the the terminal ileum.                            The colonoscopy was performed without difficulty.                             The patient tolerated the procedure well. The                            quality of the bowel preparation was adequate to                            identify polyps greater than 5 mm in size. Scope In: 8:57:47 AM Scope Out: 9:16:43 AM Scope Withdrawal Time: 0 hours 9 minutes 58 seconds  Total Procedure Duration: 0 hours 18 minutes 56 seconds  Findings:      The terminal ileum appeared normal.      The colon (entire examined portion) appeared normal.      The retroflexed view of the distal rectum and anal verge was normal and       showed no anal or rectal abnormalities.      Non-bleeding external hemorrhoids were found during perianal exam. The       hemorrhoids were small.      Note: rectal bleeding related to hemorrhoids Impression:               - The examined portion of the ileum was normal.                           - The entire examined colon is normal.                           - The distal rectum and anal verge are normal on                            retroflexion view.                           - Non-bleeding external hemorrhoids.                           - No specimens collected. Moderate Sedation:      Per Anesthesia Care Recommendation:           - Discharge patient to home (ambulatory).                           - Resume previous  diet.                           - Repeat colonoscopy in 10 years for screening                            purposes.                           - Start Amitiza 8 mcg twice a day as previously                            advised.                           - If not improving with Amitiza, can concomittantly                            start taking one capful of Miralax every day,                            uptitrate up to 3 capfuls per day as needed Procedure Code(s):        --- Professional ---                           [phone], Colonoscopy, flexible; diagnostic, including                            collection of specimen(s) by brushing or washing,                             when performed (separate procedure) Diagnosis Code(s):        --- Professional ---                           K64.4, Residual hemorrhoidal skin tags                           K62.5, Hemorrhage of anus and rectum CPT copyright 2022 American Medical Association. All rights reserved. The codes documented in this report are preliminary and upon coder review may  be revised to meet current compliance requirements. Katrinka Blazing, MD Katrinka Blazing,  12/12/2021 9:23:25 AM This report has been signed electronically. Number of Addenda: 0

## 2021-12-15 ENCOUNTER — Encounter (INDEPENDENT_AMBULATORY_CARE_PROVIDER_SITE_OTHER): Payer: Self-pay | Admitting: *Deleted

## 2021-12-16 ENCOUNTER — Other Ambulatory Visit: Payer: Self-pay | Admitting: Gastroenterology

## 2021-12-16 DIAGNOSIS — K589 Irritable bowel syndrome without diarrhea: Secondary | ICD-10-CM

## 2021-12-16 LAB — SURGICAL PATHOLOGY

## 2021-12-16 MED ORDER — DICYCLOMINE HCL 10 MG PO CAPS: 10.0000 mg | ORAL_CAPSULE | Freq: Two times a day (BID) | ORAL | 2 refills | Status: DC | PRN

## 2021-12-19 ENCOUNTER — Encounter (HOSPITAL_COMMUNITY): Payer: Self-pay | Admitting: Gastroenterology

## 2022-01-26 ENCOUNTER — Ambulatory Visit (INDEPENDENT_AMBULATORY_CARE_PROVIDER_SITE_OTHER): Payer: 59 | Admitting: Gastroenterology

## 2023-05-13 DIAGNOSIS — F410 Panic disorder [episodic paroxysmal anxiety] without agoraphobia: Secondary | ICD-10-CM | POA: Diagnosis not present

## 2023-05-18 DIAGNOSIS — J301 Allergic rhinitis due to pollen: Secondary | ICD-10-CM | POA: Diagnosis not present

## 2023-05-18 DIAGNOSIS — J3081 Allergic rhinitis due to animal (cat) (dog) hair and dander: Secondary | ICD-10-CM | POA: Diagnosis not present

## 2023-05-18 DIAGNOSIS — J3089 Other allergic rhinitis: Secondary | ICD-10-CM | POA: Diagnosis not present

## 2023-05-26 DIAGNOSIS — J3081 Allergic rhinitis due to animal (cat) (dog) hair and dander: Secondary | ICD-10-CM | POA: Diagnosis not present

## 2023-05-26 DIAGNOSIS — J301 Allergic rhinitis due to pollen: Secondary | ICD-10-CM | POA: Diagnosis not present

## 2023-05-26 DIAGNOSIS — J3089 Other allergic rhinitis: Secondary | ICD-10-CM | POA: Diagnosis not present

## 2023-06-08 DIAGNOSIS — J3081 Allergic rhinitis due to animal (cat) (dog) hair and dander: Secondary | ICD-10-CM | POA: Diagnosis not present

## 2023-06-08 DIAGNOSIS — J3089 Other allergic rhinitis: Secondary | ICD-10-CM | POA: Diagnosis not present

## 2023-06-08 DIAGNOSIS — J301 Allergic rhinitis due to pollen: Secondary | ICD-10-CM | POA: Diagnosis not present

## 2023-06-22 DIAGNOSIS — F410 Panic disorder [episodic paroxysmal anxiety] without agoraphobia: Secondary | ICD-10-CM | POA: Diagnosis not present

## 2023-06-28 ENCOUNTER — Encounter: Payer: Self-pay | Admitting: Neurology

## 2023-06-28 NOTE — Progress Notes (Unsigned)
 Assessment/Plan:  Abnormal involuntary movements  - Really did not see a lot of the movements on my examination today.  In fact, I was in the room for 45 minutes and mention to the patient that I had not seen the movements and asked if they were constant, and he admitted that they were not and that they would come and go, usually when he was in deep thought or nervous or anxious.  About 30 seconds after I asked him that question, his tongue and moved out of the mouth and jotted from 1 side to another.  He mentioned that it was an involuntary movement and drives me crazy, but I think it is my nerves.  It really did not look like tardive dyskinesia, and this really should be more of a constant movement.  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  He is not on any medication that should cause this.  We will do a wide workup to make sure that there is nothing physiologic causing these movements.  We will do ASO titers, anticardiolipin antibody,, CMP, serum ceruloplasmin, urinary copper.  Buccolingual dyskinesia can be seen with cocaine, although patient denies any use of this or history of such.  While he is on benzodiazepines, this medication is usually associated with withdrawal emergent dyskinesia and not their direct use.  Opioids are rarely associated with tardive dyskinesia, but he denies use in years.  - MRI brain will be completed as patient has very slow processing speed, especially for this age group.  My suspicion is that this is likely from the benzodiazepines, but again it is something that we will make sure that we are not missing something from a larger picture.  If the above is negative, then I do not really think that there is a further workup from a neurologic standpoint. Subjective:   Daniel Burch was seen today in neurologic consultation at the request of Vincente Grip, MD.  The consultation is for  the evaluation of abnormal movements of the mouth.  Medical records made available to me are reviewed.  I got a note from Dr. Vincente dated June 27, 2023.  Patient has a history of panic disorder and severe anxiety.  He has been treated by Dr. Vincente since October, 2024.  She saw him on a virtual visit April 01, 2023 and she noticed abnormal movements of the mouth and tongue and she apparently pointed this out to him, and he stated that he does that when he gets anxious and he felt that Xanax  helped.  He followed up with her for an in person visit on June 17 and she noted the abnormal movements in person.  She noted that the patient had never been on a D2 blocker.  Patient was sent to rule out any other causes for the movements.  He denies exposure to metoclopramide.  He has been on sertraline in the past, but it was low-dose, 50 mg and has not been on that since January, 2024.  He has not been on any antipsychotics, as above.  He denies being on antiemetics regularly (rare Zofran ).  He has not been on any antiepileptics.  He reports hx of opioid abuse but states that he has been clean x 3 years.  States that he was on suboxone but has been off of that x 5-6 months.  No illicit drugs per patient.  Looking back, patient states that he  has had the movement for maybe 6 months, maybe longer.  He notes trouble with word finding trouble but I know what I want to say but I can't get the words out.  He isn't sure that this is a medication problem.  No hx of neuroimaging.  No hx of brain injury/trauma.  Pt feels that this is more of a nervous issue.  He admits it comes and goes.    ALLERGIES:  No Known Allergies  CURRENT MEDICATIONS:  Outpatient Encounter Medications as of 06/30/2023  Medication Sig   ALPRAZolam  (XANAX ) 1 MG tablet Take 1 tablet (1 mg total) by mouth at bedtime. (Patient taking differently: Take 1 mg by mouth 4 (four) times daily.)   gabapentin (NEURONTIN) 800 MG tablet Take 800 mg by mouth 4 (four)  times daily.   levothyroxine  (SYNTHROID , LEVOTHROID) 75 MCG tablet Take 1 tablet (75 mcg total) by mouth daily before breakfast.   Buprenorphine HCl-Naloxone HCl 8-2 MG FILM Place 1 Film under the tongue daily. (Patient not taking: Reported on 06/30/2023)   [DISCONTINUED] cholecalciferol (VITAMIN D3) 25 MCG (1000 UNIT) tablet Take 1,000 Units by mouth daily.   [DISCONTINUED] dicyclomine  (BENTYL ) 10 MG capsule Take 1 capsule (10 mg total) by mouth every 12 (twelve) hours as needed for spasms (abdominal pain).   [DISCONTINUED] lubiprostone  (AMITIZA ) 8 MCG capsule Take 1 capsule (8 mcg total) by mouth 2 (two) times daily with a meal. (Patient not taking: Reported on 12/09/2021)   [DISCONTINUED] sertraline (ZOLOFT) 50 MG tablet Take 25 mg by mouth daily.   No facility-administered encounter medications on file as of 06/30/2023.    Objective:   PHYSICAL EXAMINATION:    VITALS:   Vitals:   06/30/23 0937  BP: 136/85  Pulse: 63  SpO2: 97%  Weight: 198 lb 9.6 oz (90.1 kg)  Height: 5' 11 (1.803 m)    GEN:  Normal appears male in no acute distress.  Appears stated age. HEENT:  Normocephalic, atraumatic. The mucous membranes are moist. The superficial temporal arteries are without ropiness or tenderness. Cardiovascular: Regular rate and rhythm. Lungs: Clear to auscultation bilaterally. Neck/Heme: There are no carotid bruits noted bilaterally.  NEUROLOGICAL: Orientation:  The patient is alert and oriented x 3.  He is a bit slow to answer, but answers are appropriate.  There is psychomotor retardation. Cranial nerves: There is good facial symmetry.  Extraocular muscles are intact and visual fields are full to confrontational testing. Speech is fluent and clear. Soft palate rises symmetrically and there is no tongue deviation. Hearing is intact to conversational tone. Tone: Tone is good throughout. Sensation: Sensation is intact to light touch and pinprick throughout (facial, trunk, extremities).  Vibration is intact at the bilateral big toe. There is no extinction with double simultaneous stimulation. There is no sensory dermatomal level identified. Coordination:  The patient has no difficulty with RAM's or FNF bilaterally. Motor: Strength is 5/5 in the bilateral upper and lower extremities.  Shoulder shrug is equal and symmetric. There is no pronator drift.  There are no fasciculations noted. DTR's: Deep tendon reflexes are 2/4 at the bilateral biceps, triceps, brachioradialis, patella and achilles.  Plantar responses are downgoing bilaterally. Gait and Station: The patient is able to ambulate without difficulty. The patient is able to heel toe walk without any difficulty. The patient is able to ambulate in a tandem fashion. The patient is able to stand in the Romberg position. Abnormal movements: Through the first 45 minutes of the visit, I really did not  see any movements at all of the tongue or face, either within or without of the mouth.  I then mentioned it to the patient and asked him if they were constant.  He admitted that they were not, but not long after talking about it, he did have an episode where the tongue came outside of the mouth and he moved the tongue from left to right.  He mentioned that it was involuntary, but he thought it was nerves.    Total time spent on today's visit was 60 minutes, including both face-to-face time and nonface-to-face time.  Time included that spent on review of records (prior notes available to me/labs/imaging if pertinent), discussing treatment and goals, answering patient's questions and coordinating care.   Cc:  Orpha Yancey LABOR, MD

## 2023-06-30 ENCOUNTER — Encounter: Payer: Self-pay | Admitting: Neurology

## 2023-06-30 ENCOUNTER — Ambulatory Visit: Payer: Self-pay | Admitting: Neurology

## 2023-06-30 ENCOUNTER — Other Ambulatory Visit: Payer: Self-pay | Admitting: Neurology

## 2023-06-30 VITALS — BP 136/85 | HR 63 | Ht 71.0 in | Wt 198.6 lb

## 2023-06-30 DIAGNOSIS — J3089 Other allergic rhinitis: Secondary | ICD-10-CM | POA: Diagnosis not present

## 2023-06-30 DIAGNOSIS — E039 Hypothyroidism, unspecified: Secondary | ICD-10-CM | POA: Diagnosis not present

## 2023-06-30 DIAGNOSIS — J3081 Allergic rhinitis due to animal (cat) (dog) hair and dander: Secondary | ICD-10-CM | POA: Diagnosis not present

## 2023-06-30 DIAGNOSIS — G2401 Drug induced subacute dyskinesia: Secondary | ICD-10-CM | POA: Diagnosis not present

## 2023-06-30 DIAGNOSIS — R6889 Other general symptoms and signs: Secondary | ICD-10-CM | POA: Diagnosis not present

## 2023-06-30 DIAGNOSIS — Z5181 Encounter for therapeutic drug level monitoring: Secondary | ICD-10-CM | POA: Diagnosis not present

## 2023-06-30 DIAGNOSIS — R259 Unspecified abnormal involuntary movements: Secondary | ICD-10-CM | POA: Diagnosis not present

## 2023-06-30 DIAGNOSIS — J301 Allergic rhinitis due to pollen: Secondary | ICD-10-CM | POA: Diagnosis not present

## 2023-06-30 NOTE — Patient Instructions (Signed)
 [address], [place], [place] [address]

## 2023-07-01 ENCOUNTER — Ambulatory Visit: Payer: Self-pay | Admitting: Neurology

## 2023-07-02 ENCOUNTER — Ambulatory Visit
Admission: RE | Admit: 2023-07-02 | Discharge: 2023-07-02 | Disposition: A | Discharge status: Home / Self Care | Source: Ambulatory Visit | Attending: Neurology | Admitting: Neurology

## 2023-07-02 ENCOUNTER — Telehealth: Payer: Self-pay | Admitting: Neurology

## 2023-07-02 DIAGNOSIS — Z5181 Encounter for therapeutic drug level monitoring: Secondary | ICD-10-CM

## 2023-07-02 DIAGNOSIS — R6889 Other general symptoms and signs: Secondary | ICD-10-CM

## 2023-07-02 DIAGNOSIS — E039 Hypothyroidism, unspecified: Secondary | ICD-10-CM

## 2023-07-02 DIAGNOSIS — R4182 Altered mental status, unspecified: Secondary | ICD-10-CM | POA: Diagnosis not present

## 2023-07-02 DIAGNOSIS — G2401 Drug induced subacute dyskinesia: Secondary | ICD-10-CM

## 2023-07-02 DIAGNOSIS — R259 Unspecified abnormal involuntary movements: Secondary | ICD-10-CM

## 2023-07-02 LAB — CBC WITH DIFFERENTIAL/PLATELET
Absolute Lymphocytes: 1046 {cells}/uL (ref 850–3900)
Absolute Monocytes: 290 {cells}/uL (ref 200–950)
Basophils Absolute: 29 {cells}/uL (ref 0–200)
Basophils Relative: 0.7 %
Eosinophils Absolute: 50 {cells}/uL (ref 15–500)
Eosinophils Relative: 1.2 %
HCT: 40.7 % (ref 38.5–50.0)
Hemoglobin: 13 g/dL — ABNORMAL LOW (ref 13.2–17.1)
MCH: 27.9 pg (ref 27.0–33.0)
MCHC: 31.9 g/dL — ABNORMAL LOW (ref 32.0–36.0)
MCV: 87.3 fL (ref 80.0–100.0)
MPV: 10.7 fL (ref 7.5–12.5)
Monocytes Relative: 6.9 %
Neutro Abs: 2785 {cells}/uL (ref 1500–7800)
Neutrophils Relative %: 66.3 %
Platelets: 222 10*3/uL (ref 140–400)
RBC: 4.66 10*6/uL (ref 4.20–5.80)
RDW: 13.3 % (ref 11.0–15.0)
Total Lymphocyte: 24.9 %
WBC: 4.2 10*3/uL (ref 3.8–10.8)

## 2023-07-02 LAB — DRUG MONITOR, PANEL 1, SCREEN, URINE
Amphetamines: NEGATIVE ng/mL (ref <500)
Barbiturates: NEGATIVE ng/mL (ref <300)
Benzodiazepines: POSITIVE ng/mL — AB (ref <100)
Cocaine Metabolite: NEGATIVE ng/mL (ref <150)
Creatinine: 204.8 mg/dL (ref >=20.0)
Marijuana Metabolite: NEGATIVE ng/mL (ref <20)
Methadone Metabolite: NEGATIVE ng/mL (ref <100)
Opiates: NEGATIVE ng/mL (ref <100)
Oxidant: NEGATIVE ug/mL (ref <200)
Oxycodone: NEGATIVE ng/mL (ref <100)
Phencyclidine: NEGATIVE ng/mL (ref <25)
pH: 7.5 (ref 4.5–9.0)

## 2023-07-02 LAB — COMPREHENSIVE METABOLIC PANEL WITH GFR
AG Ratio: 2 (calc) (ref 1.0–2.5)
ALT: 18 U/L (ref 9–46)
AST: 18 U/L (ref 10–40)
Albumin: 4.2 g/dL (ref 3.6–5.1)
Alkaline phosphatase (APISO): 50 U/L (ref 36–130)
BUN: 19 mg/dL (ref 7–25)
CO2: 28 mmol/L (ref 20–32)
Calcium: 9 mg/dL (ref 8.6–10.3)
Chloride: 106 mmol/L (ref 98–110)
Creat: 0.96 mg/dL (ref 0.60–1.26)
Globulin: 2.1 g/dL (ref 1.9–3.7)
Glucose, Bld: 107 mg/dL (ref 65–139)
Potassium: 4.3 mmol/L (ref 3.5–5.3)
Sodium: 141 mmol/L (ref 135–146)
Total Bilirubin: 0.4 mg/dL (ref 0.2–1.2)
Total Protein: 6.3 g/dL (ref 6.1–8.1)
eGFR: 104 mL/min/{1.73_m2} (ref >=60)

## 2023-07-02 LAB — CARDIOLIPIN ANTIBODIES, IGG, IGM, IGA
Anticardiolipin IgA: <2 [APL'U]/mL (ref <20.0)
Anticardiolipin IgG: <2 [GPL'U]/mL (ref <20.0)
Anticardiolipin IgM: <2 [MPL'U]/mL (ref <20.0)

## 2023-07-02 LAB — DM TEMPLATE

## 2023-07-02 LAB — CERULOPLASMIN: Ceruloplasmin: 25 mg/dL (ref 14–30)

## 2023-07-02 LAB — TSH: TSH: 0.66 m[IU]/L (ref 0.40–4.50)

## 2023-07-02 LAB — ANTISTREPTOLYSIN O TITER: ASO: 35 [IU]/mL (ref <200)

## 2023-07-02 NOTE — Telephone Encounter (Signed)
 Called patient and informed him of results per below. Patient verbalized understanding and had no further questions or concerns.

## 2023-07-02 NOTE — Telephone Encounter (Signed)
 Could you call the patient and let him know that all of his labs look really good (only 1 is pending currently) and his MRI brain looks good.  I do NOT think that he has tardive dyskinesia but also don't see any neurologic etiology for mouth movements.  There is nothing further to be done from my standpoint.

## 2023-07-06 DIAGNOSIS — F410 Panic disorder [episodic paroxysmal anxiety] without agoraphobia: Secondary | ICD-10-CM | POA: Diagnosis not present

## 2023-07-07 DIAGNOSIS — J3089 Other allergic rhinitis: Secondary | ICD-10-CM | POA: Diagnosis not present

## 2023-07-07 DIAGNOSIS — J301 Allergic rhinitis due to pollen: Secondary | ICD-10-CM | POA: Diagnosis not present

## 2023-07-07 DIAGNOSIS — J3081 Allergic rhinitis due to animal (cat) (dog) hair and dander: Secondary | ICD-10-CM | POA: Diagnosis not present

## 2023-07-20 DIAGNOSIS — J3081 Allergic rhinitis due to animal (cat) (dog) hair and dander: Secondary | ICD-10-CM | POA: Diagnosis not present

## 2023-07-20 DIAGNOSIS — J301 Allergic rhinitis due to pollen: Secondary | ICD-10-CM | POA: Diagnosis not present

## 2023-07-20 DIAGNOSIS — J3089 Other allergic rhinitis: Secondary | ICD-10-CM | POA: Diagnosis not present

## 2023-08-26 ENCOUNTER — Other Ambulatory Visit: Payer: Self-pay

## 2023-10-20 ENCOUNTER — Encounter (INDEPENDENT_AMBULATORY_CARE_PROVIDER_SITE_OTHER): Payer: Self-pay | Admitting: Gastroenterology
